# Patient Record
Sex: Male | Born: 1975 | Race: Black or African American | Hispanic: No | Marital: Single
Health system: Midwestern US, Community
[De-identification: ages and names within clinical notes are randomized; demographics above are authoritative.]

## PROBLEM LIST (undated history)

## (undated) DIAGNOSIS — F200 Paranoid schizophrenia: Secondary | ICD-10-CM

## (undated) DIAGNOSIS — F319 Bipolar disorder, unspecified: Secondary | ICD-10-CM

## (undated) DIAGNOSIS — J45909 Unspecified asthma, uncomplicated: Secondary | ICD-10-CM

## (undated) DIAGNOSIS — F209 Schizophrenia, unspecified: Secondary | ICD-10-CM

## (undated) HISTORY — PX: HERNIA REPAIR: SHX51

---

## 2012-07-21 ENCOUNTER — Encounter (HOSPITAL_BASED_OUTPATIENT_CLINIC_OR_DEPARTMENT_OTHER): Payer: Self-pay | Admitting: *Deleted

## 2012-07-21 ENCOUNTER — Emergency Department (HOSPITAL_BASED_OUTPATIENT_CLINIC_OR_DEPARTMENT_OTHER)
Admission: EM | Admit: 2012-07-21 | Discharge: 2012-07-21 | Disposition: A | Discharge status: Home / Self Care | Payer: Self-pay | Attending: Emergency Medicine | Admitting: Emergency Medicine

## 2012-07-21 DIAGNOSIS — Z8673 Personal history of transient ischemic attack (TIA), and cerebral infarction without residual deficits: Secondary | ICD-10-CM | POA: Insufficient documentation

## 2012-07-21 DIAGNOSIS — Z8781 Personal history of (healed) traumatic fracture: Secondary | ICD-10-CM | POA: Insufficient documentation

## 2012-07-21 DIAGNOSIS — K0889 Other specified disorders of teeth and supporting structures: Secondary | ICD-10-CM

## 2012-07-21 DIAGNOSIS — J45909 Unspecified asthma, uncomplicated: Secondary | ICD-10-CM | POA: Insufficient documentation

## 2012-07-21 DIAGNOSIS — F200 Paranoid schizophrenia: Secondary | ICD-10-CM | POA: Insufficient documentation

## 2012-07-21 DIAGNOSIS — K089 Disorder of teeth and supporting structures, unspecified: Secondary | ICD-10-CM | POA: Insufficient documentation

## 2012-07-21 DIAGNOSIS — Z79899 Other long term (current) drug therapy: Secondary | ICD-10-CM | POA: Insufficient documentation

## 2012-07-21 HISTORY — DX: Unspecified asthma, uncomplicated: J45.909

## 2012-07-21 HISTORY — DX: Paranoid schizophrenia: F20.0

## 2012-07-21 MED ORDER — IBUPROFEN 600 MG PO TABS: 600.0000 mg | ORAL_TABLET | Freq: Four times a day (QID) | ORAL | Status: DC | PRN

## 2012-07-21 MED ORDER — PENICILLIN V POTASSIUM 500 MG PO TABS: 500.0000 mg | ORAL_TABLET | Freq: Three times a day (TID) | ORAL | Status: DC

## 2012-07-21 NOTE — ED Notes (Signed)
MD at bedside. 

## 2012-07-21 NOTE — ED Notes (Signed)
Pt ambulating independently w/ steady gait on d/c in no acute distress, A&Ox4. D/c instructions reviewed w/ pt and family - pt and family deny any further questions or concerns at present. Rx given x2  

## 2012-07-21 NOTE — ED Notes (Signed)
Tooth pain x1 week.

## 2012-07-21 NOTE — ED Provider Notes (Signed)
   History    CSN: [id number] Arrival date & time 07/21/12  2103  First MD Initiated Contact with Patient 07/21/12 2243     Chief Complaint  Patient presents with  . Dental Pain   (Consider location/radiation/quality/duration/timing/severity/associated sxs/prior Treatment) HPI Patient presents with complaint of dental pain. He states that approximately one week ago one of his posterior molars broke off and he has been having pain in that area since that time. He denies any fever. He has had no difficulty breathing or swallowing. He does not have a dentist. Chewing and cold water on the tooth caused more pain. Past Medical History  Diagnosis Date  . Asthma   . Stroke   . Paranoid schizophrenia    Past Surgical History  Procedure Laterality Date  . Hernia repair     No family history on file. History  Substance Use Topics  . Smoking status: Never Smoker   . Smokeless tobacco: Not on file  . Alcohol Use: No    Review of Systems ROS reviewed and all otherwise negative except for mentioned in HPI  Allergies  Review of patient's allergies indicates no known allergies.  Home Medications   Current Outpatient Rx  Name  Route  Sig  Dispense  Refill  . ALBUTEROL IN   Inhalation   Inhale into the lungs.         Marland Kitchen OLANZapine (ZYPREXA) 15 MG tablet   Oral   Take 15 mg by mouth at bedtime.         Marland Kitchen ibuprofen (ADVIL,MOTRIN) 600 MG tablet   Oral   Take 1 tablet (600 mg total) by mouth every 6 (six) hours as needed for pain.   30 tablet   0   . penicillin v potassium (VEETID) 500 MG tablet   Oral   Take 1 tablet (500 mg total) by mouth 3 (three) times daily.   30 tablet   0    BP 103/64  Pulse 110  Temp(Src) 98.3 F (36.8 C) (Oral)  Resp 18  Wt 140 lb (63.504 kg)  SpO2 99% Vitals reviewed Physical Exam Physical Examination: General appearance - alert, well appearing, and in no distress Mental status - alert, oriented to person, place, and time Eyes - no  conjunctival injection, no scleral icterus Mouth - mucous membranes moist, pharynx normal without lesions, several broken molars, no evidence of periapical abscess, no swelling under tongue Neck - supple, no significant adenopathy Chest - clear to auscultation, no wheezes, rales or rhonchi, symmetric air entry Heart - normal rate, regular rhythm, normal S1, S2, no murmurs, rubs, clicks or gallops Extremities - peripheral pulses normal, no pedal edema, no clubbing or cyanosis Skin - normal coloration and turgor, no rashes  ED Course  Procedures (including critical care time) Labs Reviewed - No data to display No results found. 1. Pain, dental     MDM  Patient presenting with dental pain and has evidence of multiple fractured molars which appear chronic in nature. Patient started on penicillin and given prescription for ibuprofen. He was also given information about dental clinics and encouraged to followup with dentist.Discharged with strict return precautions.  Pt agreeable with plan.  Ethelda Chick, MD 07/22/12 [phone]

## 2012-07-21 NOTE — ED Notes (Signed)
Pt reports left lower tooth pain x1 week, states he broke his tooth on something while eating. Pt has not taken any pain meds for his pain, pain became unbearable tonight.

## 2013-01-21 ENCOUNTER — Encounter (HOSPITAL_COMMUNITY): Payer: Self-pay | Admitting: Emergency Medicine

## 2013-01-21 ENCOUNTER — Emergency Department (HOSPITAL_COMMUNITY)
Admission: EM | Admit: 2013-01-21 | Discharge: 2013-01-21 | Disposition: A | Discharge status: Home / Self Care | Payer: Medicaid Other | Attending: Emergency Medicine | Admitting: Emergency Medicine

## 2013-01-21 DIAGNOSIS — K056 Periodontal disease, unspecified: Secondary | ICD-10-CM | POA: Insufficient documentation

## 2013-01-21 DIAGNOSIS — K029 Dental caries, unspecified: Secondary | ICD-10-CM | POA: Insufficient documentation

## 2013-01-21 DIAGNOSIS — Z8673 Personal history of transient ischemic attack (TIA), and cerebral infarction without residual deficits: Secondary | ICD-10-CM | POA: Insufficient documentation

## 2013-01-21 DIAGNOSIS — F200 Paranoid schizophrenia: Secondary | ICD-10-CM | POA: Insufficient documentation

## 2013-01-21 DIAGNOSIS — J45909 Unspecified asthma, uncomplicated: Secondary | ICD-10-CM | POA: Insufficient documentation

## 2013-01-21 DIAGNOSIS — K089 Disorder of teeth and supporting structures, unspecified: Secondary | ICD-10-CM | POA: Insufficient documentation

## 2013-01-21 DIAGNOSIS — K0889 Other specified disorders of teeth and supporting structures: Secondary | ICD-10-CM

## 2013-01-21 DIAGNOSIS — K069 Disorder of gingiva and edentulous alveolar ridge, unspecified: Secondary | ICD-10-CM

## 2013-01-21 DIAGNOSIS — Z79899 Other long term (current) drug therapy: Secondary | ICD-10-CM | POA: Insufficient documentation

## 2013-01-21 MED ORDER — IBUPROFEN 600 MG PO TABS: 600.0000 mg | ORAL_TABLET | Freq: Four times a day (QID) | ORAL | Status: DC | PRN

## 2013-01-21 MED ORDER — PENICILLIN V POTASSIUM 500 MG PO TABS: 500.0000 mg | ORAL_TABLET | Freq: Three times a day (TID) | ORAL | Status: DC

## 2013-01-21 MED ORDER — IBUPROFEN 400 MG PO TABS
800.0000 mg | ORAL_TABLET | Freq: Once | ORAL | Status: AC
  Administered 2013-01-21: 800 mg via ORAL
  Filled 2013-01-21: qty 2

## 2013-01-21 NOTE — Discharge Instructions (Signed)
Dental Pain °A tooth ache may be caused by cavities (tooth decay). Cavities expose the nerve of the tooth to air and hot or cold temperatures. It may come from an infection or abscess (also called a boil or furuncle) around your tooth. It is also often caused by dental caries (tooth decay). This causes the pain you are having. °DIAGNOSIS  °Your caregiver can diagnose this problem by exam. °TREATMENT  °· If caused by an infection, it may be treated with medications which kill germs (antibiotics) and pain medications as prescribed by your caregiver. Take medications as directed. °· Only take over-the-counter or prescription medicines for pain, discomfort, or fever as directed by your caregiver. °· Whether the tooth ache today is caused by infection or dental disease, you should see your dentist as soon as possible for further care. °SEEK MEDICAL CARE IF: °The exam and treatment you received today has been provided on an emergency basis only. This is not a substitute for complete medical or dental care. If your problem worsens or new problems (symptoms) appear, and you are unable to meet with your dentist, call or return to this location. °SEEK IMMEDIATE MEDICAL CARE IF:  °· You have a fever. °· You develop redness and swelling of your face, jaw, or neck. °· You are unable to open your mouth. °· You have severe pain uncontrolled by pain medicine. °MAKE SURE YOU:  °· Understand these instructions. °· Will watch your condition. °· Will get help right away if you are not doing well or get worse. °Document Released: 12/31/2004 Document Revised: 03/25/2011 Document Reviewed: 08/19/2007 °ExitCare® Patient Information ©2014 [employer]. °  Emergency Department Resource Guide °1) Find a Doctor and Pay Out of Pocket °Although you won't have to find out who is covered by your insurance plan, it is a good idea to ask around and get recommendations. You will then need to call the office and see if the doctor you have chosen will  accept you as a new patient and what types of options they offer for patients who are self-pay. Some doctors offer discounts or will set up payment plans for their patients who do not have insurance, but you will need to ask so you aren't surprised when you get to your appointment. ° °2) Contact Your Local Health Department °Not all health departments have doctors that can see patients for sick visits, but many do, so it is worth a call to see if yours does. If you don't know where your local health department is, you can check in your phone book. The CDC also has a tool to help you locate your state's health department, and many state websites also have listings of all of their local health departments. ° °3) Find a Walk-in Clinic °If your illness is not likely to be very severe or complicated, you may want to try a walk in clinic. These are popping up all over the country in pharmacies, drugstores, and shopping centers. They're usually staffed by nurse practitioners or physician assistants that have been trained to treat common illnesses and complaints. They're usually fairly quick and inexpensive. However, if you have serious medical issues or chronic medical problems, these are probably not your best option. ° °No Primary Care Doctor: °- Call [facility] at  832-8000 - they can help you locate a primary care doctor that  accepts your insurance, provides certain services, etc. °- Physician Referral Service- 1-800-533-3463 ° °Chronic Pain Problems: °Organization           Address  Phone   Notes  °[facility]  (336) 297-2271 Patients need to be referred by their primary care doctor.  ° °Medication Assistance: °Organization         Address  Phone   Notes  °[place] [facility] [address] °[place], [place] [address] (336) 641-8030 --Must be a resident of [place] °-- Must have NO insurance coverage whatsoever (no Medicaid/ Medicare, etc.) °-- The pt.  MUST have a primary care doctor that directs their care regularly and follows them in the community °  °[facility]  (866) 331-1348   °[employer]  (888) 892-1162   ° °Agencies that provide inexpensive medical care: °Organization         Address  Phone   Notes  °[facility]  (336) 832-8035   °[facility]    (336) 832-7272   °[facility] [address] °[place], [place] [address] (336) 832-4777   °[facility] [address], °[place] (336) 271-4999   °[facility]    (336) 373-0678   °[facility]    (336) 272-1050   °[facility] ° [address], [place] Phone:  (336) 832-4444, Fax:  (336) 832-4440 Hours of Operation:  9 am - 6 pm, M-F.  Also accepts Medicaid/Medicare and self-pay.  °[facility] ° [address], [place] Phone: (336) 832-3150, Fax: (336) 832-3151. Hours of Operation:  8:30 am - 5:30 pm, M-F.  Also accepts Medicaid and self-pay.  °[facility] [place] [address], [place] Phone: (336) 878-6027   °[facility] [address], [place], [place] (336)723-1848, Ext. 123 Mondays & Thursdays: 7-9 AM.  First 15 patients are seen on a first come, first serve basis. °  ° °Medicaid-accepting [place] Providers: ° °Organization         Address  Phone   Notes  °[facility] [address], [place] (336) 641-2100 Also accepts self-pay patients.  °[facility] [address], [place] ° (336) 856-9996   °[facility] [address], [place] (336) 288-8857   °[facility] [address] [place] [address], [place] (336) 299-7000   °Veita Bland [address], [place]  ° (336) 373-1557 Only accepts [place] Access Medicaid patients after they have their name applied to their card.  ° °Self-Pay (no insurance) in  [place]: ° °Organization         Address  Phone   Notes  °Sickle Cell Patients, [facility] [address], [place] (336) 832-1970   °[facility] [address], [place] (336) 832-4400   ° [facility] ° [address],  (336) 992-4800   °[facility]/Dr. Osei-Bonsu ° [address] [place] [address], [place] or [address], [place] (336) 841-8500 Phone number for both [place] and [place] locations is the same.  °[facility] [address], [place] (336) 299-0000   °[facility] [address] [place] [address],  or [address] (336) 852-7530 °(336) 878-2260   °[facility] [address],  (336) 350-1642, phone; (336) 294-5005, fax Sees patients 1st and 3rd Saturday of every month.  Must not qualify   for public or private insurance (i.e. Medicaid, Medicare, [place] Health Choice, Veterans' Benefits) • Household income should be no more than 200% of the poverty level •The clinic cannot treat you if you are pregnant or think you are pregnant • Sexually transmitted diseases are not treated at the clinic.  ° °Dental Care: °Organization         Address  Phone  Notes  °[facility] [facility] [address], [place] (336) 641-6152 Accepts children up to age 21 who are enrolled in Medicaid or [place] Health Choice; pregnant women with a Medicaid card; and children who have applied for Medicaid or [place] Health Choice, but were declined, whose parents can pay a reduced fee at time of service.  °[facility] [place]  [address], [place] (336) 641-7733 Accepts children up to age 21 who are enrolled in Medicaid or [place] Health Choice; pregnant women with a Medicaid card; and children who have applied for Medicaid or [place] Health Choice, but were declined, whose parents  can pay a reduced fee at time of service.  °[facility] ° [address], [place] (336) 641-4533 Patients are seen by appointment only. Walk-ins are not accepted. [facility] will see patients 18 years of age and older. °Monday - Tuesday (8am-5pm) °Most Wednesdays (8:30-5pm) °$30 per visit, cash only  °[facility] ° [address], [place] (336) 641-4533 Patients are seen by appointment only. Walk-ins are not accepted. [facility] will see patients 18 years of age and older. °One Wednesday Evening (Monthly: Volunteer Based).  $30 per visit, cash only  °[facility]  (919) 537-3737 for adults; Children under age 4, call Graduate Pediatric Dentistry at (919) [phone]. Children aged 4-14, please call (919) [phone] to request a pediatric application. ° Dental services are provided in all areas of dental care including fillings, crowns and bridges, complete and partial dentures, implants, gum treatment, root canals, and extractions. Preventive care is also provided. Treatment is provided to both adults and children. °Patients are selected via a lottery and there is often a waiting list. °  °[facility] [address], °[place] ° (336) 763-8833 [email or web address] °  °[facility] [address], [place], [place] (336)723-1848, Ext. 123 Second and Fourth Thursday of each month, opens at 6:30 AM; Clinic ends at 9 AM.  Patients are seen on a first-come first-served basis, and a limited number are seen during each clinic.  ° °[facility] ° [address], [place], [place] (336) 723-7904   Eligibility Requirements °You must have lived in [place], or [place] counties for at least the last three months. °  You cannot be eligible for state or federal sponsored healthcare insurance, including Veterans Administration, Medicaid, or Medicare. °  You generally cannot be eligible for healthcare  insurance through your employer.  °  How to apply: °Eligibility screenings are held every Tuesday and Wednesday afternoon from 1:00 pm until 4:00 pm. You do not need an appointment for the interview!  °[facility] [address], [place], [place] 336-631-2330   °[facility]  336-342-8273   °[facility]  336-703-3100   °[facility]  336-570-6415   ° °Behavioral Health Resources in the Community: °Intensive Outpatient Programs °Organization         Address  Phone    Notes  °[facility] [address], [place], [place] 336-878-6098   °[facility] [address], [place], [place] 336-832-9800   °[facility] [address], [place], [place] ° 336-882-2125   °[facility] [address],  °[place], [place] 1-800-853-5163 or 336-641-4981   °Substance Abuse Resources °Organization         Address  Phone  Notes  °[facility]  336-882-2125   °[facility]  336-784-9470   °[facility] [place] [facility]  336-285-9073   °[facility]  336-845-3988   °Residential & Outpatient Substance Abuse Program  1-800-659-3381   °Psychological Services °Organization         Address  Phone  Notes  °[facility]  336- 832-9600   °[facility]  336- 378-7881   °[facility] [address], [place] 1-800-853-5163 or 336-641-4981   ° °Mobile Crisis Teams °Organization         Address  Phone  Notes  °[facility], Mobile Crisis Care Unit  1-877-626-1772   °Assertive °[facility] ° [address] [place], [place] 336-834-9664   °Sharon DeEsch [address] °[place] 336-554-5454   ° °Self-Help/Support Groups °Organization         Address  Phone             Notes  °[facility] [place] [facility] - variety of support groups  336- 373-1402 Call for more information  °Narcotics Anonymous (NA),  [facility] [address], °[place]  2 meetings at this location  ° °Residential Treatment Programs °Organization         Address  Phone  Notes  °[facility] [address],    °[place]  1-866-801-8205   °[facility] ° [address], [place], [place] 704-293-8524   °[facility] [address], [place] 336-845-3988 Admissions: 8am-3pm M-F  °[facility] [address]    °[place], [place] 336-841-1104   °[facility] [address], [place], [place] 336-379-7146   °[facility] [place] [facility] [address]  °[place], [place] 336-285-9073   °[facility] - Intensive Outpatient [address], [place], [place] 336-852-3033   °[facility] [address]  °[place], [place] 1-877-615-2722 or 336-784-9470   °[facility] [address] [place], [place] 336-227-7417 Accepts Medicaid  °[facility] [address]  °[place] 1-800-659-3381 Substance Abuse/Addiction Treatment  ° °[place] Behavioral Health Resources °Organization         Address  Phone  Notes  °[facility]  (888) 581-9988   °Julie Brannon, PhD [address] [place], [place]   (336) 349-5553 or (336) 951-0000   °[facility]   [address] °[place], [place] (336) 349-4454   °[facility] [address], [place], [place] (336) 342-8316 Insurance/Medicaid/sponsorship through [facility]  °[facility] [address]                                    [place], [place] (336) 342-8316 Therapy/tele-psych/case  °[facility] [address]  ° [place],  (336) 349-2233    °Dr. Arfeen  (336) 349-4544   °[facility] [place]  [employer] [place]   [facility] 1) [address], [place] °2) [address], [place] °3)  [address], [place] (336) 349-3220 °(336) 342-7768 ° °(336) 342-8140     °[place] [facility] Abuse [facility] (336) 342-1394 or (336) 342-3537 (After Hours)    ° °   °

## 2013-01-21 NOTE — ED Notes (Signed)
The pt has had a toothache for 2-3 days 

## 2013-01-21 NOTE — ED Provider Notes (Signed)
CSN: [id number]     Arrival date & time 01/21/13  2203 History   First MD Initiated Contact with Patient 01/21/13 2314     Chief Complaint  Patient presents with  . Dental Pain   (Consider location/radiation/quality/duration/timing/severity/associated sxs/prior Treatment) Patient is a 38 y.o. male presenting with tooth pain. The history is provided by the patient. No language interpreter was used.  Dental Pain Location:  Lower Lower teeth location:  31/RL 2nd molar Quality:  Throbbing Severity:  Mild Onset quality:  Gradual Duration:  2 days Timing:  Constant Progression:  Worsening Chronicity:  New Context: dental caries and poor dentition   Relieved by:  Nothing Worsened by:  Touching and pressure Ineffective treatments: Applying topical onion slices. Associated symptoms: no difficulty swallowing, no drooling, no facial pain, no facial swelling, no fever, no neck pain, no neck swelling, no oral lesions and no trismus   Risk factors: lack of dental care and periodontal disease     Past Medical History  Diagnosis Date  . Asthma   . Stroke   . Paranoid schizophrenia    Past Surgical History  Procedure Laterality Date  . Hernia repair     No family history on file. History  Substance Use Topics  . Smoking status: Never Smoker   . Smokeless tobacco: Not on file  . Alcohol Use: No    Review of Systems  Constitutional: Negative for fever.  HENT: Positive for dental problem. Negative for drooling, facial swelling and mouth sores.   Musculoskeletal: Negative for neck pain.  All other systems reviewed and are negative.    Allergies  Review of patient's allergies indicates no known allergies.  Home Medications   Current Outpatient Rx  Name  Route  Sig  Dispense  Refill  . ALBUTEROL IN   Inhalation   Inhale into the lungs.         Marland Kitchen ibuprofen (ADVIL,MOTRIN) 600 MG tablet   Oral   Take 1 tablet (600 mg total) by mouth every 6 (six) hours as needed for pain.  30 tablet   0   . OLANZapine (ZYPREXA) 15 MG tablet   Oral   Take 15 mg by mouth at bedtime.         . penicillin v potassium (VEETID) 500 MG tablet   Oral   Take 1 tablet (500 mg total) by mouth 3 (three) times daily.   30 tablet   0    BP 120/68  Pulse 73  Temp(Src) 97.9 F (36.6 C) (Oral)  Resp 20  Wt 142 lb 3 oz (64.496 kg)  SpO2 98%  Physical Exam  Nursing note and vitals reviewed. Constitutional: He is oriented to person, place, and time. He appears well-developed and well-nourished. No distress.  HENT:  Head: Normocephalic and atraumatic.  Mouth/Throat: Oropharynx is clear and moist. No oropharyngeal exudate.  Uvula midline and patient tolerating secretions without difficulty. Gingival redness and swelling without fluctuance to R lower 1st and 2nd molar. +Dental caries.  Eyes: Conjunctivae and EOM are normal. No scleral icterus.  Neck: Normal range of motion. Neck supple.  No nuchal rigidity or meningismus  Cardiovascular: Normal rate, regular rhythm and normal heart sounds.   Pulmonary/Chest: Effort normal. No stridor. No respiratory distress.  Musculoskeletal: Normal range of motion.  Lymphadenopathy:    He has no cervical adenopathy.  Neurological: He is alert and oriented to person, place, and time.  Skin: Skin is warm and dry. No rash noted. He is not diaphoretic. No  erythema. No pallor.  Psychiatric: He has a normal mood and affect. His behavior is normal.    ED Course  Procedures (including critical care time) Labs Review Labs Reviewed - No data to display Imaging Review No results found.  EKG Interpretation   None       MDM   1. Dentalgia    Uncomplicated dentalgia. Patient well and nontoxic appearing, hemodynamically stable, and afebrile. Uvula midline without evidence of PTA. No red flags or signs concerning for Ludwig's angina or spread of infection. Airway patent and patient tolerating secretions without difficulty. No trismus or  stridor. Patient stable for d/c with dental f/u. Veetid prescribed to cover for infection and ibuprofen advised for pain control. Return precautions dicussed and patient agreeable to plan with no unaddressed concerns.  Filed Vitals:   01/21/13 2205  BP: 120/68  Pulse: 73  Temp: 97.9 F (36.6 C)  TempSrc: Oral  Resp: 20  Weight: 142 lb 3 oz (64.496 kg)  SpO2: 98%       Antony MaduraKelly Raidyn Wassink, PA-C 01/21/13 2336

## 2013-01-22 NOTE — ED Provider Notes (Signed)
Medical screening examination/treatment/procedure(s) were performed by non-physician practitioner and as supervising physician I was immediately available for consultation/collaboration.   Dione Boozeavid Ruqaya Strauss, MD 01/22/13 [phone]

## 2013-02-07 ENCOUNTER — Encounter (HOSPITAL_COMMUNITY): Payer: Self-pay | Admitting: Emergency Medicine

## 2013-02-07 ENCOUNTER — Emergency Department (HOSPITAL_COMMUNITY)
Admission: EM | Admit: 2013-02-07 | Discharge: 2013-02-09 | Disposition: A | Discharge status: Other Acute Inpatient Hospital | Payer: Medicaid Other | Attending: Emergency Medicine | Admitting: Emergency Medicine

## 2013-02-07 ENCOUNTER — Emergency Department (HOSPITAL_COMMUNITY): Payer: Medicaid Other

## 2013-02-07 DIAGNOSIS — R45851 Suicidal ideations: Secondary | ICD-10-CM | POA: Insufficient documentation

## 2013-02-07 DIAGNOSIS — Z79899 Other long term (current) drug therapy: Secondary | ICD-10-CM | POA: Insufficient documentation

## 2013-02-07 DIAGNOSIS — R4689 Other symptoms and signs involving appearance and behavior: Secondary | ICD-10-CM

## 2013-02-07 DIAGNOSIS — R443 Hallucinations, unspecified: Secondary | ICD-10-CM | POA: Insufficient documentation

## 2013-02-07 DIAGNOSIS — Z8673 Personal history of transient ischemic attack (TIA), and cerebral infarction without residual deficits: Secondary | ICD-10-CM | POA: Insufficient documentation

## 2013-02-07 DIAGNOSIS — J45909 Unspecified asthma, uncomplicated: Secondary | ICD-10-CM | POA: Insufficient documentation

## 2013-02-07 DIAGNOSIS — R4589 Other symptoms and signs involving emotional state: Secondary | ICD-10-CM

## 2013-02-07 DIAGNOSIS — Z8659 Personal history of other mental and behavioral disorders: Secondary | ICD-10-CM | POA: Insufficient documentation

## 2013-02-07 LAB — CBC WITH DIFFERENTIAL/PLATELET
Basophils Absolute: 0 10*3/uL (ref 0.0–0.1)
Basophils Relative: 0 % (ref 0–1)
EOS PCT: 1 % (ref 0–5)
Eosinophils Absolute: 0.1 10*3/uL (ref 0.0–0.7)
HEMATOCRIT: 44.9 % (ref 39.0–52.0)
HEMOGLOBIN: 15.8 g/dL (ref 13.0–17.0)
LYMPHS ABS: 1.5 10*3/uL (ref 0.7–4.0)
LYMPHS PCT: 22 % (ref 12–46)
MCH: 29.9 pg (ref 26.0–34.0)
MCHC: 35.2 g/dL (ref 30.0–36.0)
MCV: 85 fL (ref 78.0–100.0)
MONO ABS: 0.4 10*3/uL (ref 0.1–1.0)
MONOS PCT: 6 % (ref 3–12)
NEUTROS ABS: 5 10*3/uL (ref 1.7–7.7)
Neutrophils Relative %: 71 % (ref 43–77)
Platelets: 162 10*3/uL (ref 150–400)
RBC: 5.28 MIL/uL (ref 4.22–5.81)
RDW: 13.4 % (ref 11.5–15.5)
WBC: 7 10*3/uL (ref 4.0–10.5)

## 2013-02-07 LAB — COMPREHENSIVE METABOLIC PANEL
ALBUMIN: 4.1 g/dL (ref 3.5–5.2)
ALK PHOS: 48 U/L (ref 39–117)
ALT: 17 U/L (ref 0–53)
AST: 25 U/L (ref 0–37)
BUN: 10 mg/dL (ref 6–23)
CALCIUM: 9.6 mg/dL (ref 8.4–10.5)
CO2: 27 mEq/L (ref 19–32)
Chloride: 101 mEq/L (ref 96–112)
Creatinine, Ser: 1.14 mg/dL (ref 0.50–1.35)
GFR calc non Af Amer: 81 mL/min — ABNORMAL LOW (ref >90)
GLUCOSE: 85 mg/dL (ref 70–99)
POTASSIUM: 4.2 meq/L (ref 3.7–5.3)
SODIUM: 141 meq/L (ref 137–147)
TOTAL PROTEIN: 8 g/dL (ref 6.0–8.3)
Total Bilirubin: 0.4 mg/dL (ref 0.3–1.2)

## 2013-02-07 LAB — ETHANOL: Alcohol, Ethyl (B): <11 mg/dL (ref 0–11)

## 2013-02-07 MED ORDER — ZOLPIDEM TARTRATE 5 MG PO TABS: 5.0000 mg | ORAL_TABLET | Freq: Every evening | ORAL | Status: DC | PRN

## 2013-02-07 MED ORDER — NICOTINE 21 MG/24HR TD PT24
21.0000 mg | MEDICATED_PATCH | Freq: Every day | TRANSDERMAL | Status: DC
  Administered 2013-02-07: 21 mg via TRANSDERMAL
  Filled 2013-02-07: qty 1

## 2013-02-07 MED ORDER — ACETAMINOPHEN 325 MG PO TABS: 650.0000 mg | ORAL_TABLET | ORAL | Status: DC | PRN

## 2013-02-07 MED ORDER — LORAZEPAM 1 MG PO TABS: 1.0000 mg | ORAL_TABLET | Freq: Three times a day (TID) | ORAL | Status: DC | PRN

## 2013-02-07 NOTE — ED Notes (Signed)
miniister to come in to visit patient

## 2013-02-07 NOTE — ED Notes (Addendum)
H/o MH hx, here tonight for SI, h/o similar, reports had put a belt around neck tonight PTA, then decided to walk here, "SI thoughts triggered by ending of relationship", denies SA issues, hears anger rage voices, describes as command 'to do harmful things to body", [facility] [place] [facility] here alone, new to [place] from [place],  Denies ETOH or drug use (h/o drug use), does not smoke. Last ate yesterday. Reports physical sx of numbness ("d/t cold outside"), "nausea r/t hunger", "dry mouth r/t thirst".  Denies HI.

## 2013-02-07 NOTE — ED Provider Notes (Signed)
CSN: [id number]     Arrival date & time 02/07/13  2004 History   First MD Initiated Contact with Patient 02/07/13 2038     Chief Complaint  Patient presents with  . Suicidal   (Consider location/radiation/quality/duration/timing/severity/associated sxs/prior Treatment) HPI Comments: Patient long-standing psych history has been off his Zyprexa for over a year.  He is moving to [place] within the last several months from [place].  He doesn't have any more Zyprexa because he and his several of his mental health appointments.  Tonight.  He, states he put a belt around his neck in a suicide attempt.  When this failed.  He walked in walk to walk and walked he still feeling suicidal.  He is hearing voices.  He thinks people are chasing him.  They're after him.  The history is provided by the patient.    Past Medical History  Diagnosis Date  . Asthma   . Stroke   . Paranoid schizophrenia    Past Surgical History  Procedure Laterality Date  . Hernia repair     History reviewed. No pertinent family history. History  Substance Use Topics  . Smoking status: Never Smoker   . Smokeless tobacco: Not on file  . Alcohol Use: No    Review of Systems  Constitutional: Negative for fever.  HENT: Negative for trouble swallowing.   Psychiatric/Behavioral: Positive for suicidal ideas, hallucinations and self-injury.  All other systems reviewed and are negative.    Allergies  Review of patient's allergies indicates no known allergies.  Home Medications   Current Outpatient Rx  Name  Route  Sig  Dispense  Refill  . ibuprofen (ADVIL,MOTRIN) 600 MG tablet   Oral   Take 1 tablet (600 mg total) by mouth every 6 (six) hours as needed.   30 tablet   0   . penicillin v potassium (VEETID) 500 MG tablet   Oral   Take 1 tablet (500 mg total) by mouth 3 (three) times daily.   30 tablet   0    BP 115/74  Pulse 104  Temp(Src) 98 F (36.7 C) (Oral)  Resp 16  Wt 139 lb 1 oz  (63.078 kg)  SpO2 99% Physical Exam  Nursing note and vitals reviewed. Constitutional: He is oriented to person, place, and time. He appears well-developed and well-nourished. No distress.  HENT:  Right Ear: External ear normal.  Left Ear: External ear normal.  Mouth/Throat: Oropharynx is clear and moist.  No difficulty swallowing.  No marks on the neck to indicate a strep or rope around his neck  Eyes: Pupils are equal, round, and reactive to light.  Neck: Normal range of motion. No muscular tenderness present. No tracheal deviation present.  No marks or bruising around the throat or neck  Cardiovascular: Normal rate and regular rhythm.   Pulmonary/Chest: Effort normal and breath sounds normal.  Musculoskeletal: Normal range of motion.  Neurological: He is alert and oriented to person, place, and time.  Skin: Skin is warm. No erythema.    ED Course  Procedures (including critical care time) Labs Review Labs Reviewed  COMPREHENSIVE METABOLIC PANEL - Abnormal; Notable for the following:    GFR calc non Af Amer 81 (*)    All other components within normal limits  CBC WITH DIFFERENTIAL  ETHANOL  URINE RAPID DRUG SCREEN (HOSP PERFORMED)   Imaging Review Dg Neck Soft Tissue  02/07/2013   CLINICAL DATA:  Neck pain  EXAM: NECK SOFT TISSUES - 1+ VIEW  COMPARISON:  None.  FINDINGS: There is no evidence of retropharyngeal soft tissue swelling or epiglottic enlargement. The cervical airway is unremarkable and no radio-opaque foreign body identified. Minor lower cervical degenerative disc disease at C5-6 and C6-7.  IMPRESSION: No acute finding by plain radiography   Electronically Signed   By: Ruel Favors M.D.   On: 02/07/2013 21:47    EKG Interpretation   None       MDM   1. Suicidal behavior     As it does not look like he is in any distress.  Have ordered medical screening labs, and a soft tissue neck, do, to his reported suicide attempt. Vision has been assessed by TTS.  He  meets criteria for admission.  They are in the process of obtaining a bed placement.   Arman Filter, NP 02/07/13 1610  Arman Filter, NP 02/08/13 [phone]

## 2013-02-07 NOTE — ED Notes (Signed)
House Coverage aware of need for sitter.  

## 2013-02-07 NOTE — ED Notes (Signed)
Patient transported back from xray 

## 2013-02-07 NOTE — ED Notes (Signed)
Patient transported to X-ray 

## 2013-02-07 NOTE — ED Notes (Signed)
Belongings bagged & labeled. Security called to wand pt, plan & process explained. Back to E37 with EMT, AC notified.
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
## 2013-02-07 NOTE — BH Assessment (Signed)
Received call for tele-assessment. Spoke with Earley Darren Schulz, NP who has a history of schizophrenia, is off medications and says he attempted to hang himself. He reports auditory hallucinations. Tele-assessment will be initiated.  Harlin RainFord Ellis Ria CommentWarrick Jr, LPC, [facility] Triage Specialist

## 2013-02-07 NOTE — BH Assessment (Signed)
Assessment complete. [facility] adult unit is at capacity. Consulted with Alberteen SamFran Hobson, NP who agrees Pt meets criteria for inpatient psychiatric treatment. [facility] when a bed is available and TTS will contact other facilities for placement. Notified Earley FavorGail Schulz, NP of disposition.  Harlin RainFord Ellis Ria CommentWarrick Jr, LPC, [facility] Triage Specialist

## 2013-02-07 NOTE — ED Notes (Signed)
Getting into paper scrubs, security called to wand pt. Pt remains alert, NAD, calm, interactive, cooperative, polite.

## 2013-02-07 NOTE — ED Notes (Signed)
[name] [facility](apostle)   [phone]

## 2013-02-08 ENCOUNTER — Encounter (HOSPITAL_COMMUNITY): Payer: Self-pay | Admitting: Emergency Medicine

## 2013-02-08 LAB — RAPID URINE DRUG SCREEN, HOSP PERFORMED
AMPHETAMINES: NOT DETECTED
BARBITURATES: NOT DETECTED
BENZODIAZEPINES: NOT DETECTED
COCAINE: NOT DETECTED
Opiates: NOT DETECTED
TETRAHYDROCANNABINOL: NOT DETECTED

## 2013-02-08 NOTE — Progress Notes (Signed)
B.Dakhari Zuver, MHT completed placement search by contacting the following facilities in efforts to secure placement;   [facility] for [facility] faxed for review no beds appropriate for this patient at this time [facility] for [facility] fax[facility] for review

## 2013-02-08 NOTE — ED Notes (Addendum)
Pt resting, watching TV, NAD, calm, repositioning self, visualized on CCTV monitor. Sitter present. Security rounding.

## 2013-02-08 NOTE — ED Notes (Signed)
PATIENT STATES HIS FAMILY AND KIDS HAVE "TURNED THEIR BACK ON ME" (REFERRING TO FAMILY IN [place]). STATES HE CAME HERE ABOUT 3 MONTHS AGO AND IS STAYING WITH RELATIVES. STATES THEY ARE SUPPORTIVE OF HIM. STATES HE HAD BEEN ON ZYPREXA 15 MG AT BEDTIME BUT HAS BEEN OFF FOR ABOUT A YEAR BECAUSE "THEY CLOSED MY CASE".  STATES SINCE BEING OFF THE ZYPREXA HE STATES HE HAS BEEN MORE "MOODY AND EDGY". STATES HE HAD BEEN TRYING TO RETURN TO [place] TO VISIT HIS FAMILY AND CHILDREN BUT THEY WERE NOT OPEN TO HIM COMING FOR A VISIT. STATES THAT HURT HIM AND THAT IS WHY HE TRIED TO HANG HIMSELF WITH A BELT. PT STATES HE HAS NOT SOUGHT ANY PSYCHIATRIC CARE SINCE BEING IN [place]. HE IS AGREEABLE TO ADMISSION TO A PSYCHIATRIC FACILITY.

## 2013-02-08 NOTE — ED Notes (Addendum)
Patient requested and received graham crackers and ginger ale.

## 2013-02-08 NOTE — ED Notes (Signed)
Notify Dr. Azucena Kubaocharty that patient told the nurses he felt light headedness while ambulating to the bathroom, dr. Azucena Kubadocharty review the ortho statics vital signs aware of situation no further orders given at this time

## 2013-02-08 NOTE — ED Notes (Signed)
Pt at desk to use phone.

## 2013-02-08 NOTE — BH Assessment (Signed)
Per Luwanda Daniels, AC at [facility], adult unit is at capacity. Contacted the following facilities for placement:  [facility]: At capacity [facility]: At capacity [facility]: At capacity [facility]: At capacity [facility]: At capacity [facility]: At capacity [facility]: At capacity [facility]: At capacity [facility]: At capacity [facility]: At capacity [facility]: At capacity [facility]: At capacity [facility]: At capacity [facility]: At capacity   Clenton Esper Ellis Kriss Ishler Jr, LPC, [facility] Triage Specialist  

## 2013-02-08 NOTE — ED Notes (Signed)
ALL CLOTHES , PERSONAL ITEMS WERE INVENTORY, AS WELL AS VALUABLES WERE LOCK UP IN SECURITY .

## 2013-02-09 ENCOUNTER — Inpatient Hospital Stay (HOSPITAL_COMMUNITY)
Admission: AD | Admit: 2013-02-09 | Discharge: 2013-02-18 | DRG: 885 | Disposition: A | Discharge status: Home / Self Care | Payer: Medicaid Other | Source: Intra-hospital | Attending: Psychiatry | Admitting: Psychiatry

## 2013-02-09 ENCOUNTER — Encounter (HOSPITAL_COMMUNITY): Payer: Self-pay | Admitting: *Deleted

## 2013-02-09 DIAGNOSIS — G4700 Insomnia, unspecified: Secondary | ICD-10-CM | POA: Diagnosis present

## 2013-02-09 DIAGNOSIS — F329 Major depressive disorder, single episode, unspecified: Secondary | ICD-10-CM | POA: Diagnosis present

## 2013-02-09 DIAGNOSIS — Z5987 Material hardship due to limited financial resources, not elsewhere classified: Secondary | ICD-10-CM

## 2013-02-09 DIAGNOSIS — Z818 Family history of other mental and behavioral disorders: Secondary | ICD-10-CM

## 2013-02-09 DIAGNOSIS — F29 Unspecified psychosis not due to a substance or known physiological condition: Secondary | ICD-10-CM | POA: Diagnosis present

## 2013-02-09 DIAGNOSIS — F411 Generalized anxiety disorder: Secondary | ICD-10-CM | POA: Diagnosis present

## 2013-02-09 DIAGNOSIS — F3289 Other specified depressive episodes: Secondary | ICD-10-CM | POA: Diagnosis present

## 2013-02-09 DIAGNOSIS — Z598 Other problems related to housing and economic circumstances: Secondary | ICD-10-CM

## 2013-02-09 DIAGNOSIS — T71162A Asphyxiation due to hanging, intentional self-harm, initial encounter: Secondary | ICD-10-CM | POA: Diagnosis present

## 2013-02-09 DIAGNOSIS — F200 Paranoid schizophrenia: Principal | ICD-10-CM | POA: Diagnosis present

## 2013-02-09 MED ORDER — MAGNESIUM HYDROXIDE 400 MG/5ML PO SUSP: 30.0000 mL | Freq: Every day | ORAL | Status: DC | PRN

## 2013-02-09 MED ORDER — TRAZODONE HCL 50 MG PO TABS
50.0000 mg | ORAL_TABLET | Freq: Every evening | ORAL | Status: DC | PRN
  Administered 2013-02-09: 50 mg via ORAL
  Filled 2013-02-09: qty 1
  Filled 2013-02-09: qty 3

## 2013-02-09 MED ORDER — OLANZAPINE 10 MG PO TBDP
10.0000 mg | ORAL_TABLET | Freq: Three times a day (TID) | ORAL | Status: DC | PRN
  Administered 2013-02-12: 10 mg via ORAL
  Filled 2013-02-09: qty 1

## 2013-02-09 MED ORDER — ALUM & MAG HYDROXIDE-SIMETH 200-200-20 MG/5ML PO SUSP: 30.0000 mL | ORAL | Status: DC | PRN

## 2013-02-09 MED ORDER — ACETAMINOPHEN 325 MG PO TABS: 650.0000 mg | ORAL_TABLET | Freq: Four times a day (QID) | ORAL | Status: DC | PRN

## 2013-02-09 NOTE — Progress Notes (Signed)
B.Dyke Weible, MHT completed support paper work with patient and explained program rules [facility]. He acknowledge understanding of program rules and was agreeable to being admitted. Support [facility] and original copies provided to [facility] transporter. Drinda ButtsAnnette, RN attending has arranged for transfer.

## 2013-02-09 NOTE — ED Notes (Addendum)
SOCIAL WORK AND CASE MANAGEMENT HAVE BEEN IN TO SEE PT AND GIVE HIM COMMUNITY RESOURCES TO HELP HIM WHEN HE IS RELEASED FROM THE HOSPITAL. ALSO SPOKE TO PT ABOUT HIS ZYPREXA. STATES THAT HE HASNT HAD A PRESCRIPTION FOR IT SINCE HE HAS MOVED TO [place]. HE IS NOT OPPOSED TO TAKING HIS MEDICATION.

## 2013-02-09 NOTE — ED Notes (Signed)
[facility]  CALLED  FOR  TRANSPORT   

## 2013-02-09 NOTE — ED Provider Notes (Signed)
Medical screening examination/treatment/procedure(s) were performed by non-physician practitioner and as supervising physician I was immediately available for consultation/collaboration.  EKG Interpretation   None         Shanna CiscoMegan E Docherty, MD 02/09/13 [phone]

## 2013-02-09 NOTE — Progress Notes (Signed)
Case Manager consult for discharge planning .Patient has MEDICAID [place].He reports he does not have a PCP.Education provided on Importance of establishing PCP services. Patient provided  Resources for [facility] providers.Patient receptive to resources and requests help getting a local provider.CM provided resources for the [facility]. Patient provided his consent for this CM to e-mail the [facility].

## 2013-02-09 NOTE — [facility] Counselor (Signed)
Per Tanna SavoyEric AC at [facility], pt has been accepted to bed 403-2 by Alberteen SamFran Hobson NP to services of Akintayo MD.  Evette Cristalaroline Paige Alexyss Balzarini, [place] Assessment Counselor

## 2013-02-09 NOTE — [facility] Group Notes (Signed)
Adult Psychoeducational Group Note  Date:  02/09/2013 Time:  9:07 PM  Group Topic/Focus:  Wrap-Up Group:   The focus of this group is to help patients review their daily goal of treatment and discuss progress on daily workbooks.  Participation Level:  Minimal  Participation Quality:  Attentive  Affect:  Appropriate  Cognitive:  Appropriate  Insight: Appropriate  Engagement in Group:  Limited  Modes of Intervention:  Discussion  Additional Comments:  [name] stated his day was wonderful and he got to see his wife.  He stated he has wisdom and a good personality.  Caroll RancherLindsay, Castor Gittleman A 02/09/2013, 9:07 PM

## 2013-02-09 NOTE — ED Provider Notes (Signed)
Filed Vitals:   02/09/13 0546  BP: 90/54  Pulse: 62  Temp: 98.6 F (37 C)  Resp: 18   Awaiting psych placement.  Medically stable.  Celene KrasJon R Lawton Dollinger, MD 02/09/13 [phone]

## 2013-02-09 NOTE — Progress Notes (Signed)
Patient ID: Darren [name], male   DOB: [birth year], 38 y.o.   MRN: [id number] Nursing admission note:  Patient is a 38 yo [facility]  He stated that he had walked several miles in the cold and felt someone was following him.  He stated that he had attempted to commit suicide by placing a belt around his neck and attaching it to a railing.  Patient reports command auditory hallucination to harm himself.  He had a psychiatric admission in the past ([place]) where he was treated for his schizophrenia.  Patient has now been off his medications for a year.  He states, "I was taking zyprexa 15 mg at bedtime."  He reports mood swings, decreased concentration and insomnia.  He also has lost a significant amount of weight during this past year due to decreased appetite.  He reports several prior suicide attempts.  He reports staying up all night and sleeping 4-5 hours during the day.  He denies any drug or ETOH use.  Patient states that one of his stressors is being estranged from his family.  He has a wife and 5 children that he resides with.  He states that a relative told him that his mother and other family do not wish to having anything to do with him.  Patient [place] been in the [place] area for 3 months due to a move from [place].  He is not employed and receives disability.  Patient is pleasant; speech coherent; good eye contact.  His affect is anxious; mood depressed.  He denies any SI/HI at this time.  He states the voices have decreased "in volume and are not as bad."  He is anxious to restart his medication.  Patient reports past medical hx of stroke in 2006.  He also has hx of asthma.  Patient was oriented to room and unit.

## 2013-02-09 NOTE — ED Notes (Signed)
[facility] here to transport 
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
## 2013-02-09 NOTE — Progress Notes (Signed)
CSW consult to pt for community resources. CS[facility] and [facility] process for new pt. intake. CSW also explained the [facility] services as well. Pt thought he would benefit. Pt awaiting placement.   Darren PouchDoris Solaris Kram,LCSWA [phone]

## 2013-02-10 ENCOUNTER — Encounter (HOSPITAL_COMMUNITY): Payer: Self-pay | Admitting: Psychiatry

## 2013-02-10 DIAGNOSIS — F200 Paranoid schizophrenia: Secondary | ICD-10-CM | POA: Diagnosis present

## 2013-02-10 MED ORDER — OCUVITE-LUTEIN PO CAPS: 1.0000 | ORAL_CAPSULE | Freq: Every day | ORAL | Status: DC

## 2013-02-10 MED ORDER — ALBUTEROL SULFATE HFA 108 (90 BASE) MCG/ACT IN AERS
2.0000 | INHALATION_SPRAY | Freq: Four times a day (QID) | RESPIRATORY_TRACT | Status: DC | PRN
  Administered 2013-02-13 – 2013-02-16 (×3): 2 via RESPIRATORY_TRACT
  Filled 2013-02-10: qty 6.7

## 2013-02-10 MED ORDER — OLANZAPINE 5 MG PO TBDP
15.0000 mg | ORAL_TABLET | Freq: Every day | ORAL | Status: DC
  Administered 2013-02-10 – 2013-02-13 (×4): 15 mg via ORAL
  Filled 2013-02-10 (×5): qty 1

## 2013-02-10 MED ORDER — ENSURE COMPLETE PO LIQD
237.0000 mL | Freq: Two times a day (BID) | ORAL | Status: DC
  Administered 2013-02-10 – 2013-02-18 (×13): 237 mL via ORAL

## 2013-02-10 MED ORDER — OLANZAPINE 10 MG PO TBDP: 10.0000 mg | ORAL_TABLET | Freq: Every day | ORAL | Status: DC

## 2013-02-10 MED ORDER — PROSIGHT PO TABS
1.0000 | ORAL_TABLET | Freq: Every day | ORAL | Status: DC
  Administered 2013-02-10 – 2013-02-18 (×8): 1 via ORAL
  Filled 2013-02-10: qty 1
  Filled 2013-02-10: qty 3
  Filled 2013-02-10: qty 1
  Filled 2013-02-10: qty 3
  Filled 2013-02-10 (×7): qty 1
  Filled 2013-02-10: qty 3

## 2013-02-10 NOTE — [facility] Group Notes (Signed)
[facility]  02/10/2013  11:28 AM  Type of Therapy:  Mental [employer] Presentation   Participation Level:  Active  Participation Quality:  Appropriate  Affect:  Appropriate  Cognitive:  Appropriate  Insight:  Engaged  Engagement in Therapy:  Engaged  Modes of Intervention:  Discussion, Education and Socialization   Summary of Progress/Problems:  Onalee HuaDavid from [employer] came to present his recovery story and play the guitar.  Darren NeedleMichael sat quietly and listened as the speaker told his story.  He asked the speaker the location of [facility] and the speaker's substance use history.  He discussed with the speaker about grieving through significant lost.    Darren [name]   02/10/2013  11:28 AM

## 2013-02-10 NOTE — [facility] Suicide Risk Assessment (Signed)
[facility] INPATIENT:  Family/Significant Other Suicide Prevention Education  Suicide Prevention Education:  Education Completed; Girlfriend,  [name] [phone] has been identified by the patient as the family member/significant other with whom the patient will be residing, and identified as the person(s) who will aid the patient in the event of a mental health crisis (suicidal ideations/suicide attempt).  With written consent from the patient, the family member/significant other has been provided the following suicide prevention education, prior to the and/or following the discharge of the patient.  The suicide prevention education provided includes the following:  Suicide risk factors  Suicide prevention and interventions  National Suicide Hotline telephone number  [facility] assessment telephone number  [facility] Emergency Assistance 911  [facility] and/or Residential Mobile Crisis Unit telephone number  Request made of family/significant other to:  Remove weapons (e.g., guns, rifles, knives), all items previously/currently identified as safety concern.    Remove drugs/medications (over-the-counter, prescriptions, illicit drugs), all items previously/currently identified as a safety concern.  The family member/significant other verbalizes understanding of the suicide prevention education information provided.  The family member/significant other agrees to remove the items of safety concern listed above.  Simona HuhYang, Cherelle Midkiff 02/10/2013, 11:04 AM

## 2013-02-10 NOTE — [facility] Suicide Risk Assessment (Signed)
Suicide Risk Assessment  Admission Assessment     Nursing information obtained from:    Demographic factors:    Current Mental Status:    Loss Factors:    Historical Factors:    Risk Reduction Factors:     CLINICAL FACTORS:   Depression:   Delusional Impulsivity Insomnia Schizophrenia:   Command hallucinatons Less than 37 years old Paranoid or undifferentiated type Currently Psychotic  COGNITIVE FEATURES THAT CONTRIBUTE TO RISK:  Polarized thinking    SUICIDE RISK:   Minimal: No identifiable suicidal ideation.  Patients presenting with no risk factors but with morbid ruminations; may be classified as minimal risk based on the severity of the depressive symptoms  PLAN OF CARE:1. Admit for crisis management and stabilization. 2. Medication management to reduce current symptoms to base line and improve the     patient's overall level of functioning 3. Treat health problems as indicated. 4. Develop treatment plan to decrease risk of relapse upon discharge and the need for     readmission. 5. Psycho-social education regarding relapse prevention and self care. 6. Health care follow up as needed for medical problems. 7. Restart home medications where appropriate.   I certify that inpatient services furnished can reasonably be expected to improve the patient's condition.  [name], Rocquel Askren, MD 02/10/2013, 11:25 AM

## 2013-02-10 NOTE — [facility] Group Notes (Signed)
[facility] Note   02/10/2013 10:25 AM  Participation Quality:  Engaged  Mood/Affect:  Flat  Depression Rating:  1  Anxiety Rating:  1  Thoughts of Suicide:  No Will you contract for safety?   NA  Current AVH:  No  Plan for Discharge/Comments:  [name] states it was his idea to come in "so I could get help with changing my outlook."  Says that relationships have been a challenge, especially with family.  [place]  Admits to hearing voices previous to admission, "but not anymore since I am back on Zyprexa again."  States pastor is a support.    Transportation Means: family  Supports: family  [name], Baldo DaubRodney B
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
## 2013-02-10 NOTE — Progress Notes (Signed)
Patient ID: Darren [name], male   DOB: 1975-10-05, 38 y.o.   MRN: [id number] D: patient has been up in the day room interacting with staff and others.  He has been playing cards with his peers.  He has brighter affect today; depressed mood.  He states his auditory hallucinations have decreased.  He denies SI/HI.  Patient states, "I have a long history of schizophrenia."  A: continue to monitor medication management and MD orders.  Safety checks completed every 15 minutes per protocol.  R: patient receptive to staff and his behavior is appropriate.

## 2013-02-10 NOTE — Progress Notes (Signed)
D: Patient in his room reading his bible on approach.  Patient states he ha had a good day.  Patient states he has been reading today and states he was able to go to the gym and play basketball.  Patient states, "There are people out there who have worser issues that me."  Patient states people complain about the wrong things.  Patient denies SI/HI and denies AVH. A: Staff to monitor Q 15 mins for safety.  Encouragement and support offered.  Scheduled medications administered per orders. R: Patient remains safe on the unit.  Patient attended group tonight.  Patient visible on the unit and interacting with peers.  Patient taking adminsitered medications.

## 2013-02-10 NOTE — Progress Notes (Signed)
NUTRITION ASSESSMENT  Pt identified as at risk on the Malnutrition Screen Tool  INTERVENTION: 1. Educated patient on the importance of nutrition and encouraged intake of food and beverages. 2. Discussed weight goals. 3. Supplements: MVI daily and Ensure Complete po BID, each supplement provides 350 kcal and 13 grams of protein   NUTRITION DIAGNOSIS: Unintentional weight loss related to sub-optimal intake as evidenced by pt report.   Goal: Pt to meet >/= 90% of their estimated nutrition needs.  Monitor:  PO intake  Assessment:  Patient admitted with psychosis.  Reports good intake and improving appetite.  Also reports good appetite prior to admit but "was not eating what I should".  Requested MVI and receptive to Ensure.  UBW 168 lbs 2 years ago per patient, stable for the past 6 months per e-chart.  38 y.o. male  Height: Ht Readings from Last 1 Encounters:  02/09/13 5\' 6"  (1.676 m)    Weight: Wt Readings from Last 1 Encounters:  02/09/13 139 lb (63.05 kg)    Weight Hx: Wt Readings from Last 10 Encounters:  02/09/13 139 lb (63.05 kg)  02/07/13 139 lb 1 oz (63.078 kg)  01/21/13 142 lb 3 oz (64.496 kg)  07/21/12 140 lb (63.504 kg)    BMI:  Body mass index is 22.45 kg/(m^2). Pt meets criteria for normal weight based on current BMI.  Estimated Nutritional Needs: Kcal: 25-30 kcal/kg Protein: > 1 gram protein/kg Fluid: 1 ml/kcal  Diet Order: General Pt is also offered choice of unit snacks mid-morning and mid-afternoon.  Pt is eating as desired.   Lab results and medications reviewed.   Oran ReinLaura Kit Brubacher, RD, LDN Clinical Inpatient Dietitian Pager:  [phone] Weekend and after hours pager:  [phone]

## 2013-02-10 NOTE — Tx Team (Signed)
  Interdisciplinary Treatment Plan Update   Date Reviewed: 02/10/2013 Time Reviewed:  8:10 AM  Progress in Treatment:   Attending groups: Yes Participating in groups: Yes Taking medication as prescribed: Yes  Tolerating medication: Yes Family/Significant other contact made: Yes Patient understands diagnosis: Yes, AEB asking for help with medication.      Discussing patient identified problems/goals with staff: Yes.  See initial care plan Medical problems stabilized or resolved: Yes Denies suicidal/homicidal ideation: Yes, In txt team Patient has not harmed self or others: Yes For review of initial/current patient goals, please see plan of care.  Estimated Length of Stay:  4-5 Days   Reasons for Continued Hospitalization:  Auditory Hallucination  Medication stabilization Suicidal ideation  New Problems/Goals identified:    Discharge Plan or Barriers:   Pt plans to return home, follow up outp.     Additional Comments: Patient is a 38 yo male who [facility]. He stated that he had walked several miles in the cold and felt someone was following him. He stated that he had attempted to commit suicide by placing a belt around his neck and attaching it to a railing. Patient reports command auditory hallucination to harm himself. He had a psychiatric admission in the past (state of [place]) where he was treated for his schizophrenia. Patient has now been off his medications for a year. He states, "I was taking zyprexa 15 mg at bedtime." He reports mood swings, decreased concentration and insomnia. He also has lost a significant amount of weight during this past year due to decreased appetite. He reports several prior suicide attempts. He reports staying up all night and sleeping 4-5 hours during the day. He denies any drug or ETOH use. Patient states that one of his stressors is being estranged from his family. He has a wife and 5 children that he resides with. He states that a  relative told him that his mother and other family do not wish to having anything to do with him. Patient has only been in the [place] area for 3 months due to a move from [place]. He is not employed and receives disability.    Attendees:  Signature: Thedore MinsMojeed Akintayo, MD  02/09/2013 8:08 AM   Signature: Richelle Itood Aracelis Ulrey, LCSW  02/09/2013 8:08 AM   Signature: Fransisca KaufmannLaura Davis, NP  02/09/2013 8:08 AM   Signature: Joslyn Devonaroline Beaudry, RN  02/09/2013 8:08 AM   Signature: Liborio NixonPatrice White, RN  02/09/2013 8:08 AM   Signature:  02/09/2013 8:08 AM   Signature:  02/09/2013 8:08 AM   Signature:    Signature:    Signature:    Signature:    Signature:    Signature:    Scribe for Treatment Team:  Simona Huhina Yang MSW Intern 02/09/2013 8:08 AM
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
## 2013-02-11 NOTE — BHH Group Notes (Signed)
BHH Group Notes:  (Counselor/Nursing/MHT/Case Management/Adjunct)  02/11/2013 1:15PM  Type of Therapy:  Group Therapy  Participation Level:  Minimal  Participation Quality:  Appropriate  Affect:  Lethargic  Cognitive:  Oriented  Insight:  Improving  Engagement in Group:  Limited  Engagement in Therapy:  Limited  Modes of Intervention:  Discussion, Exploration and Socialization  Summary of Progress/Problems: The topic for group was balance in life.  Pt participated in the discussion about when their life was in balance and out of balance and how this feels.  Pt discussed ways to get back in balance and short term goals they can work on to get where they want to be. [name] was sleeping for much of the group.  He answered when directly spoken to, but interaction was minimal. Talked abut being in the [employer], and that "good followers make good leaders," but could not really clarify what that meant.  Said that he slept through lunch, and that the medication must be making him tired.   [name], Jakhia Buxton B 02/11/2013 1:51 PM

## 2013-02-11 NOTE — Progress Notes (Signed)
[facility] Note  02/11/2013 11:08 AM Darren [name]  MRN:  [id number] Subjective:" I have been going through a lot stress since we moved to [place] from [place].'' Objective: Patient reports that he is feeling less stressed out today. He reports decreased mood swings, paranoia and psychosis. Patient states that he is happy to get back on his medication last night which helped her to get some sleep. He id still reporting passive suicidal thoughts but contracting for safety. He is compliant with Zyprexa and has not endorsed any adverse reactions. 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 Asthma     ADL's:  Intact  Sleep: Fair  Appetite:  Fair  Suicidal Ideation: yes Plan:  denies Intent:  denies Means:  denies Homicidal Ideation:  Plan:  denies Intent:  denies Means:  denies AEB (as evidenced by):  Psychiatric Specialty Exam: Review of Systems  Constitutional: Negative.   HENT: Negative.   Eyes: Negative.   Respiratory: Negative.   Cardiovascular: Negative.   Gastrointestinal: Negative.   Genitourinary: Negative.   Musculoskeletal: Negative.   Skin: Negative.   Neurological: Negative.   Endo/Heme/Allergies: Negative.   Psychiatric/Behavioral: Positive for suicidal ideas and hallucinations. The patient is nervous/anxious.     Blood pressure 123/82, pulse 100, temperature 97.6 F (36.4 C), temperature source Oral, resp. rate 18, height 5\' 6"  (1.676 m), weight 63.05 kg (139 lb).Body mass index is 22.45 kg/(m^2).  General Appearance: Disheveled  Eye Contact::  Minimal  Speech:  Garbled  Volume:   Decreased  Mood:  Dysphoric  Affect:  Blunt  Thought Process:  Circumstantial  Orientation:  Full (Time, Place, and Person)  Thought Content:  Delusions and Hallucinations: Auditory  Suicidal Thoughts:  Yes.  without intent/plan  Homicidal Thoughts:  No  Memory:  Immediate;   Fair Recent;   Fair Remote;   Fair  Judgement:  Poor  Insight:  Lacking  Psychomotor Activity:  Decreased  Concentration:  Fair  Recall:  Fair  Akathisia:  No  Handed:  Right  AIMS (if indicated):     Assets:  Communication Skills Desire for Improvement Social Support  Sleep:  Number of Hours: 5.75   Current Medications: Current Facility-Administered Medications  Medication Dose Route Frequency Provider Last Rate Last Dose  . acetaminophen (TYLENOL) tablet 650 mg  650 mg Oral Q6H PRN Fransisca KaufmannLaura Davis, NP      . albuterol (PROVENTIL HFA;VENTOLIN HFA) 108 (90 BASE) MCG/ACT inhaler 2 puff  2 puff Inhalation Q6H PRN Fransisca KaufmannLaura Davis, NP      . alum & mag hydroxide-simeth (MAALOX/MYLANTA) 200-200-20 MG/5ML suspension 30 mL  30 mL Oral Q4H PRN Fransisca KaufmannLaura Davis, NP      . feeding supplement (ENSURE COMPLETE) (ENSURE COMPLETE) liquid 237 mL  237 mL Oral BID BM Jeoffrey MassedLaura Lee Jobe, RD   237 mL at 02/11/13 0957  . magnesium hydroxide (MILK OF MAGNESIA) suspension 30 mL  30 mL Oral Daily PRN Fransisca KaufmannLaura Davis, NP      . multivitamin (PROSIGHT) tablet 1 tablet  1 tablet Oral Daily Nylani Michetti   1 tablet at 02/11/13 0819  . OLANZapine zydis (ZYPREXA) disintegrating tablet 10 mg  10 mg Oral Q8H PRN Fransisca KaufmannLaura Davis, NP      .  OLANZapine zydis (ZYPREXA) disintegrating tablet 15 mg  15 mg Oral QHS Brienne Liguori   15 mg at 02/10/13 2123  . traZODone (DESYREL) tablet 50 mg  50 mg Oral QHS PRN Fransisca Kaufmann, NP   50 mg at 02/09/13 2232    Lab Results: No results found for this or any previous visit (from the past 48 hour(s)).  Physical Findings: AIMS: Facial and Oral Movements Muscles of Facial Expression: None, normal Lips and Perioral Area:  None, normal Jaw: None, normal Tongue: None, normal,Extremity Movements Upper (arms, wrists, hands, fingers): None, normal Lower (legs, knees, ankles, toes): None, normal, Trunk Movements Neck, shoulders, hips: None, normal, Overall Severity Severity of abnormal movements (highest score from questions above): None, normal Incapacitation due to abnormal movements: None, normal, Dental Status Current problems with teeth and/or dentures?: No Does patient usually wear dentures?: No  CIWA:    COWS:     Treatment Plan Summary: Daily contact with patient to assess and evaluate symptoms and progress in treatment Medication management  Plan:1. Admit for crisis management and stabilization. 2. Medication management to reduce current symptoms to base line and improve the  patient's overall level of functioning:Continue Zyprexa 15mg  po Qhs for psychosis/delusions/mood. 3. Treat health problems as indicated. 4. Develop treatment plan to decrease risk of relapse upon discharge and the need for readmission. 5. Psycho-social education regarding relapse prevention and self care. 6. Health care follow up as needed for medical problems. 7. Restart home medications where appropriate.   Medical Decision Making Problem Points:  Established problem, stable/improving (1), Review of last therapy session (1) and Review of psycho-social stressors (1) Data Points:  Order Aims Assessment (2) Review or order clinical lab tests (1) Review of medication regiment & side effects (2) Review of new medications or change in dosage (2)  I certify that inpatient services furnished can reasonably be expected to improve the patient's condition.   Thedore Mins, MD 02/11/2013, 11:08 AM

## 2013-02-11 NOTE — Progress Notes (Signed)
Adult Psychoeducational Group Note  Date:  02/11/2013 Time:  10:57 PM  Group Topic/Focus:  Goals Group:   The focus of this group is to help patients establish daily goals to achieve during treatment and discuss how the patient can incorporate goal setting into their daily lives to aide in recovery.  Participation Level:  Active  Participation Quality:  Appropriate  Affect:  Appropriate  Cognitive:  Appropriate  Insight: Appropriate  Engagement in Group:  Engaged  Modes of Intervention:  Discussion  Additional Comments:  Pt stated that his day was good, the highlight of the day was him going off the unity and playing ball with the others.  Terie PurserParker, Mykeria Garman R 02/11/2013, 10:57 PM

## 2013-02-11 NOTE — Progress Notes (Signed)
D: Patient in the dayroom on approach.  Patient appear flat and depressed.  Patient states there is nothing wrong but appears sad.  Patient states his wife did not visit today.  Patient states he has had a lot of things on his mind like his wife's pregnancy.  Patient states he is worried about what her body has to go through.  Patient states he has learned that he has to take things one day at a time and states he has learned about self control. A: Staff to monitor Q 15 mins for safety.  Encouragement and support offered.  Scheduled medications administered per orders. R: Patient remains safe on the unit.  Patient attended group tonight.  Patient visible on the unit tonight.  Patient taking administered medications.

## 2013-02-11 NOTE — Progress Notes (Signed)
Patient ID: Darren [name], male   DOB: 09-03-1975, 38 y.o.   MRN: [id number]  D: Pt. Denies SI/HI and A/V Hallucinations. Patient does not report any pain or discomfort at this time. Patient did not fill out daily inventory sheet.  A: Support and encouragement provided to the patient to come to writer with any questions or concerns. Scheduled medications given to patient per physician's orders.  R: Patient is receptive and cooperative but minimal and forwards very little. Patient did not attend morning wellness group and is only seen in the milieu minimally. Q15 minute checks are maintained for safety.

## 2013-02-11 NOTE — Progress Notes (Signed)
Psychoeducational Group Note  Date:  02/10/2013 Time:  1015  Group Topic/Focus:  Crisis Planning:   The purpose of this group is to help patients create a crisis plan for use upon discharge or in the future, as needed.  Participation Level: Did Not Attend  Participation Quality:  Not Applicable  Affect:  Not Applicable  Cognitive:  Not Applicable  Insight:  Not Applicable  Engagement in Group: Not Applicable  Additional Comments:  PT STAYED IN ROOM. DID NOT ATTEND  Devin Foskey A 02/11/2013, 7:43 AM

## 2013-02-11 NOTE — Progress Notes (Signed)
Patient ID: Darren [name], male   DOB: 04-12-1975, 38 y.o.   MRN: [id number]  Morning Wellness Group 10:00  The focus of this group is to educate the patient on the purpose and policies of crisis stabilization and provide a format to answer questions about their admission.  The group details unit policies and expectations of patients while admitted.  Patient did not attend

## 2013-02-12 NOTE — BHH Group Notes (Signed)
BHH LCSW Group Therapy  02/12/2013 1:07 PM  Type of Therapy:  Group Therapy   Participation Level:  Minimal  Participation Quality:  Drowsy  Affect:  Flat  Cognitive:  Appropriate  Insight:  Engaged  Engagement in Therapy:  Lacking  Modes of Intervention:  Discussion and Socialization   Summary of Progress/Problems:  Chaplain was here to lead a group on themes of hope and courage.  Darren NeedleMichael presented as drowsy.  He defined community as the people who participate actively  and make it a better place.  He identified his positive community as his wife and the church.  He identified themes pf prayer, motivation and trust as those things that are part of a supportive community.     Darren [name]   02/12/2013  1:07 PM

## 2013-02-12 NOTE — Progress Notes (Signed)
Psychoeducational Group Note  Date:  02/12/2013 Time:  8:00 pm.   Group Topic/Focus:  Wrap-Up Group:   The focus of this group is to help patients review their daily goal of treatment and discuss progress on daily workbooks.  Participation Level: Did Not Attend  Participation Quality:  Not Applicable  Affect:  Not Applicable  Cognitive:  Not Applicable  Insight:  Not Applicable  Engagement in Group: Not Applicable  Additional Comments:  The patient did not attend since he was asleep.   Budd Freiermuth S 02/12/2013, 10:11 PM

## 2013-02-12 NOTE — [facility] Group Notes (Signed)
[facility]   02/12/2013 9:53 AM  Participation Quality:  Minimal  Mood/Affect:  Flat  Depression Rating:  1  Anxiety Rating:  1  Thoughts of Suicide:  No Will you contract for safety?   NA  Current AVH:  No  Plan for Discharge/Comments:  Darren NeedleMichael sat quietly, looking down during group.  He stated that he feels like he is doing a bit better today.  Had nothing else to offer.  Is happy with the Zyprexa.  Is OK with being with us through the weekend.  Transportation Means: friend  Supports: friend   [name], [name]

## 2013-02-12 NOTE — Progress Notes (Signed)
Patient ID: Darren [name], male   DOB: Sep 18, 1975, 38 y.o.   MRN: [id number] D. Patient presents with suspicious, paranoid mood, affect flat. Patient very guarded and not forthcoming of thoughts to writer, although compliant with ward rules. Patient states when writer asked of reason for admission '' I wasn't right in the head. But It's a lot going on, can't talk about it now '' Patient has been isolative to room throughout most of shift with minimal interaction with peers. Pt completed self inventory and rates depression at 1/10 on depression scale 10 being worst depressed 1 being least, however mood and affect not congruent with what patient reports. Pt appears to be intermittently anxious at times as well. A. Medications given as ordered, support and encouragement provided.Discussed above information with treatment team. R. Patient has been cooperative, with no further voiced concerns at this time. Will continue to monitor q 15 minutes for safety.

## 2013-02-12 NOTE — Progress Notes (Signed)
Patient ID: Darren [name], male   DOB: Jan 31, 1975, 38 y.o.   MRN: [id number] [facility] MD Progress Note  02/12/2013 1:39 PM Darren [name]  MRN:  [id number] Subjective: Patient states "I feel like my mood is better now that I'm back on medication. I'm feeling like being more active now. I've prayed about my life stressors so I'm not as worried."  Objective: Patient is reporting decreased symptoms of paranoia, mood swings, auditory hallucinations since being restarted on Zyprexa Zydis. The patient had been off medications for the last year prior to his admission to [facility]. Patient is visible on the unit today and attending the scheduled groups. The patient rates his depression very low but continues to appear very sad. Darren [name] is very guarded during his interactions with staff and forwards very little information. The patient is not willing to reveal details of his psychosocial stressors. Nursing staff have expressed concern that the patient has been acting paranoid on the unit and acting suspicious of others. He appears to be minimizing his psychiatric symptoms. Patient was noted to receive a prn dose of Zyprexa Zydis 10 this morning for psychosis.   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 Asthma     ADL's:  Intact  Sleep: Fair  Appetite:  Fair  Suicidal Ideation:  Passive SI with no plan  Homicidal Ideation:  Plan:  denies Intent:  denies Means:  denies AEB (as evidenced by):  Psychiatric Specialty Exam: Review of Systems  Constitutional: Negative.   HENT: Negative.   Eyes: Negative.   Respiratory: Negative.   Cardiovascular: Negative.   Gastrointestinal: Negative.   Genitourinary: Negative.    Musculoskeletal: Negative.   Skin: Negative.   Neurological: Negative.   Endo/Heme/Allergies: Negative.   Psychiatric/Behavioral: Positive for depression, suicidal ideas and hallucinations. Negative for memory loss and substance abuse. The patient is nervous/anxious. The patient does not have insomnia.     Blood pressure 124/88, pulse 100, temperature 97.7 F (36.5 C), temperature source Oral, resp. rate 18, height 5\' 6"  (1.676 m), weight 63.05 kg (139 lb).Body mass index is 22.45 kg/(m^2).  General Appearance: Casual  Eye Contact::  Minimal  Speech:  Clear and Coherent  Volume:  Decreased  Mood:  Dysphoric  Affect:  Blunt  Thought Process:  Circumstantial  Orientation:  Full (Time, Place, and Person)  Thought Content:  Delusions and Hallucinations: Auditory  Suicidal Thoughts:  Yes.  without intent/plan  Homicidal Thoughts:  No  Memory:  Immediate;   Fair Recent;   Fair Remote;   Fair  Judgement:  Poor  Insight:  Lacking  Psychomotor Activity:  Decreased  Concentration:  Fair  Recall:  Fair  Akathisia:  No  Handed:  Right  AIMS (if indicated):     Assets:  Communication Skills Desire for Improvement Social Support  Sleep:  Number of Hours: 6.75   Current Medications: Current Facility-Administered Medications  Medication Dose Route Frequency Provider Last Rate Last Dose  . acetaminophen (TYLENOL) tablet 650 mg  650 mg Oral Q6H PRN Darren Kaufmann, NP      . albuterol (PROVENTIL HFA;VENTOLIN HFA) 108 (90 BASE) MCG/ACT inhaler 2 puff  2 puff Inhalation Q6H PRN Darren Kaufmann, NP      . alum & mag hydroxide-simeth (MAALOX/MYLANTA) 200-200-20 MG/5ML suspension 30 mL  30 mL Oral Q4H PRN Darren KaufmannLaura Davis, NP      . feeding supplement (ENSURE COMPLETE) (ENSURE COMPLETE) liquid 237 mL  237 mL Oral BID BM Darren MassedLaura Lee [name], RD   237 mL at 02/12/13 1000  . magnesium hydroxide (MILK OF MAGNESIA) suspension 30 mL  30 mL Oral Daily PRN Darren KaufmannLaura Davis, NP      . multivitamin (PROSIGHT) tablet 1 tablet   1 tablet Oral Daily Darren [name]   1 tablet at 02/12/13 0802  . OLANZapine zydis (ZYPREXA) disintegrating tablet 10 mg  10 mg Oral Q8H PRN Darren KaufmannLaura Davis, NP   10 mg at 02/12/13 0802  . OLANZapine zydis (ZYPREXA) disintegrating tablet 15 mg  15 mg Oral QHS Darren [name]   15 mg at 02/11/13 2115  . traZODone (DESYREL) tablet 50 mg  50 mg Oral QHS PRN Darren KaufmannLaura Davis, NP   50 mg at 02/09/13 2232    Lab Results: No results found for this or any previous visit (from the past 48 hour(s)).  Physical Findings: AIMS: Facial and Oral Movements Muscles of Facial Expression: None, normal Lips and Perioral Area: None, normal Jaw: None, normal Tongue: None, normal,Extremity Movements Upper (arms, wrists, hands, fingers): None, normal Lower (legs, knees, ankles, toes): None, normal, Trunk Movements Neck, shoulders, hips: None, normal, Overall Severity Severity of abnormal movements (highest score from questions above): None, normal Incapacitation due to abnormal movements: None, normal, Dental Status Current problems with teeth and/or dentures?: No Does patient usually wear dentures?: No  CIWA:    COWS:     Treatment Plan Summary: Daily contact with patient to assess and evaluate symptoms and progress in treatment Medication management  Plan:1. Continue crisis management and stabilization. 2. Medication management to reduce current symptoms to base line and improve the patient's overall level of functioning:Continue Zyprexa 15mg  po Qhs for psychosis/delusions/mood and 10 mg every eight hours prn agitation or acute psychosis.  3. Treat health problems as indicated. 4. Develop treatment plan to decrease risk of relapse upon discharge and the need for readmission. 5. Psycho-social education regarding relapse prevention and self care. 6. Health care follow up as needed for medical problems. Continue albuterol inhaler prn asthma symptoms of shortness of breath or wheezing.  7. Restart home medications  where appropriate.  Medical Decision Making Problem Points:  Established problem, stable/improving (1), Review of last therapy session (1) and Review of psycho-social stressors (1) Data Points:  Order Aims Assessment (2) Review of medication regiment & side effects (2) Review of new medications or change in dosage (2)  I certify that inpatient services furnished can reasonably be expected to improve the patient's condition.   Darren KaufmannDAVIS, LAURA, NP-C 02/12/2013, 1:39 PM  Patient seen, evaluated and I agree with notes by Nurse Practitioner. Thedore MinsMojeed Arion Morgan, MD

## 2013-02-12 NOTE — Progress Notes (Signed)
Adult Psychoeducational Group Note  Date:  02/12/2013 Time:  10:22 AM  Group Topic/Focus:  Early Warning Signs:   The focus of this group is to help patients identify signs or symptoms they exhibit before slipping into an unhealthy state or crisis.  Participation Level:  Did Not Attend  Additional Comments:  Pt did not attend group due to being in the bed asleep.  Cathlean CowerClouse, Micheline Markes Y 02/12/2013, 10:22 AM

## 2013-02-13 DIAGNOSIS — F29 Unspecified psychosis not due to a substance or known physiological condition: Secondary | ICD-10-CM

## 2013-02-13 NOTE — [facility] Group Notes (Signed)
[facility] Group Notes:  (Nursing/MHT/Case Management/Adjunct)  Date:  02/13/2013  Time:  0930 Type of Therapy:  Nurse Education  Participation Level:  Did Not Attend  Participation Quality:    Affect:    Cognitive:    Insight:    Engagement in Group:    Modes of Intervention:    Summary of Progress/Problems:  Darren [name], Darren [name] 02/13/2013, 10:44 AM

## 2013-02-13 NOTE — Progress Notes (Signed)
The focus of this group is to help patients review their daily goal of treatment and discuss progress on daily workbooks. Pt attended the evening group session and responded to all discussion prompts from the Writer. Pt shared that today was a good day on the unit, the highlight of which was going to the gym to play basketball. Pt reported having no additional needs from Nursing Staff. Pt's affect was appropriate.

## 2013-02-13 NOTE — Progress Notes (Signed)
Patient ID: Darren [name], male   DOB: 06/16/1975, 38 y.o.   MRN: [id number] [facility] MD Progress Note  02/13/2013 11:01 AM Darren [name]  MRN:  [id number] Subjective: endorses stress and feel somewhat recovered but guarded. Objective: Patient reports that he is feeling less stressed out today. Tolerting zyprexa. Still endorses paranoia. Somewhat sedated today. Endorses some hallucinations but not commanding. Mood depressed with suicidal toughts but no plan. 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 Asthma     ADL's:  Intact  Sleep: Fair  Appetite:  Fair  Suicidal Ideation: yes Plan:  denies Intent:  denies Means:  denies Homicidal Ideation:  Plan:  denies Intent:  denies Means:  denies AEB (as evidenced by):  Psychiatric Specialty Exam: Review of Systems  Constitutional: Negative.   HENT: Negative.   Eyes: Negative.   Respiratory: Negative.   Cardiovascular: Negative.   Gastrointestinal: Negative.   Genitourinary: Negative.   Musculoskeletal: Negative.   Skin: Negative.   Neurological: Negative.   Endo/Heme/Allergies: Negative.   Psychiatric/Behavioral: Positive for suicidal ideas and hallucinations. The patient is nervous/anxious.     Blood pressure 127/85, pulse 116, temperature 97.6 F (36.4 C), temperature source Oral, resp. rate 16, height 5\' 6"  (1.676 m), weight 63.05 kg (139 lb).Body mass index is 22.45 kg/(m^2).  General Appearance: Disheveled  Eye Contact::  Minimal  Speech:  Garbled  Volume:  Decreased  Mood:  Dysphoric  Affect:  Blunt  Thought Process:  Circumstantial  Orientation:  Full (Time, Place, and Person)  Thought Content:  Delusions and Hallucinations:  Auditory  Suicidal Thoughts:  Yes.  without intent/plan  Homicidal Thoughts:  No  Memory:  Immediate;   Fair Recent;   Fair Remote;   Fair  Judgement:  Poor  Insight:  Lacking  Psychomotor Activity:  Decreased  Concentration:  Fair  Recall:  Fair  Akathisia:  No  Handed:  Right  AIMS (if indicated):     Assets:  Communication Skills Desire for Improvement Social Support  Sleep:  Number of Hours: 5.75   Current Medications: Current Facility-Administered Medications  Medication Dose Route Frequency Provider Last Rate Last Dose  . acetaminophen (TYLENOL) tablet 650 mg  650 mg Oral Q6H PRN Fransisca Kaufmann, NP      . albuterol (PROVENTIL HFA;VENTOLIN HFA) 108 (90 BASE) MCG/ACT inhaler 2 puff  2 puff Inhalation Q6H PRN Fransisca Kaufmann, NP      . alum & mag hydroxide-simeth (MAALOX/MYLANTA) 200-200-20 MG/5ML suspension 30 mL  30 mL Oral Q4H PRN Fransisca Kaufmann, NP      . feeding supplement (ENSURE COMPLETE) (ENSURE COMPLETE) liquid 237 mL  237 mL Oral BID BM Jeoffrey Massed, RD   237 mL at 02/12/13 1341  . magnesium hydroxide (MILK OF MAGNESIA) suspension 30 mL  30 mL Oral Daily PRN Fransisca Kaufmann, NP      . multivitamin (PROSIGHT) tablet 1 tablet  1 tablet Oral Daily Mojeed Akintayo   1 tablet at 02/12/13 0802  . OLANZapine zydis (ZYPREXA) disintegrating tablet 10 mg  10 mg Oral Q8H PRN Fransisca Kaufmann, NP   10 mg at 02/12/13 0802  . OLANZapine zydis (ZYPREXA) disintegrating tablet 15 mg  15 mg Oral QHS Mojeed Akintayo   15 mg at 02/12/13 2149  . traZODone (DESYREL)  tablet 50 mg  50 mg Oral QHS PRN Fransisca KaufmannLaura Davis, NP   50 mg at 02/09/13 2232    Lab Results: No results found for this or any previous visit (from the past 48 hour(s)).  Physical Findings: AIMS: Facial and Oral Movements Muscles of Facial Expression: None, normal Lips and Perioral Area: None, normal Jaw: None, normal Tongue: None, normal,Extremity Movements Upper (arms, wrists, hands, fingers): None, normal Lower (legs, knees, ankles, toes):  None, normal, Trunk Movements Neck, shoulders, hips: None, normal, Overall Severity Severity of abnormal movements (highest score from questions above): None, normal Incapacitation due to abnormal movements: None, normal, Dental Status Current problems with teeth and/or dentures?: No Does patient usually wear dentures?: No  CIWA:    COWS:     Treatment Plan Summary: Daily contact with patient to assess and evaluate symptoms and progress in treatment Medication management  Plan:1. Admit for crisis management and stabilization. 2. Medication management to reduce current symptoms to base line and improve the  patient's overall level of functioning:Continue Zyprexa 15mg  po Qhs for psychosis/delusions/mood. 3. Treat health problems as indicated. 4. Develop treatment plan to decrease risk of relapse upon discharge and the need for readmission. 5. Psycho-social education regarding relapse prevention and self care. 6. Health care follow up as needed for medical problems. 7. Restart home medications where appropriate.   Medical Decision Making Problem Points:  Established problem, stable/improving (1), Review of last therapy session (1) and Review of psycho-social stressors (1) Data Points:  Review medications and side effects.  Inpatient services furnished can reasonably be expected to improve the patient's condition.   Thresa RossAKHTAR, Drevon Plog, MD 02/13/2013, 11:01 AM

## 2013-02-13 NOTE — [facility] Group Notes (Signed)
[facility] Group Notes: (Clinical Social Work)   02/13/2013      Type of Therapy:  Group Therapy   Participation Level:  Did Not Attend    Darren MantleMareida Grossman-Orr, LCSW 02/13/2013, 1:15 PM

## 2013-02-13 NOTE — Progress Notes (Addendum)
Darren [name]  is  status quo, remaining  Guarded; isolative and to himself.  He does not want to talk to this nurse when he is approached.   A He refused to take his morning meds. He did not attend any group thus far and he has not completed his daily self inventory sheet that he is requested to.   R Safety is in place and poc cont to be maintaiend.

## 2013-02-14 MED ORDER — OLANZAPINE 10 MG PO TBDP
10.0000 mg | ORAL_TABLET | Freq: Every day | ORAL | Status: DC
  Administered 2013-02-14 – 2013-02-17 (×4): 10 mg via ORAL
  Filled 2013-02-14 (×2): qty 1
  Filled 2013-02-14 (×2): qty 3
  Filled 2013-02-14 (×2): qty 1
  Filled 2013-02-14: qty 3

## 2013-02-14 NOTE — [facility] Group Notes (Signed)
[facility] Group Notes:  (Nursing/MHT/Case Management/Adjunct)  Date:  02/14/2013  Time:  1000  Type of Therapy:  spirituality  Participation Level:  Minimal  Participation Quality:  Appropriate  Affect:  Appropriate  Cognitive:  Appropriate  Insight:  Appropriate  Engagement in Group:  Improving  Modes of Intervention:  Socialization  Summary of Progress/Problems:  Darren [name], Darren [name] 02/14/2013, 11:30 AM

## 2013-02-14 NOTE — [facility] Group Notes (Signed)
[facility] Group Notes:  (Clinical Social Work)  02/14/2013   11:15am-12:00pm  Summary of Progress/Problems:  The main focus of today's process group was to listen to a variety of genres of music and to identify that different types of music provoke different responses.  The patient then was able to identify personally what was soothing for them, as well as energizing.  Handouts were used to record feelings evoked, as well as how patient can personally use this knowledge in sleep habits, with depression, and with other symptoms.  The patient expressed understanding of concepts, as well as knowledge of how each type of music affected him and how this can be used at home as a wellness/recovery tool.  Type of Therapy:  Music Therapy   Participation Level:  Active  Participation Quality:  Attentive and Sharing  Affect:  Blunted  Cognitive:  Oriented  Insight:  Engaged  Engagement in Therapy:  Engaged  Modes of Intervention:   Activity, Exploration  Ambrose MantleMareida Grossman-Orr, LCSW 02/14/2013, 12:30pm

## 2013-02-14 NOTE — Progress Notes (Signed)
Pt states his day has been good. More visibile in milieu tonight and pt reporting less sedation from zyprexa. Somewhat guarded but no paranoia noted or expressed. Medicated per orders. Albuterol inhaler required this evening with relief. Support and encouragement given. He denies SI/HI/AVH and remains safe. Lawrence MarseillesFriedman, Vaneza Pickart Eakes

## 2013-02-14 NOTE — Progress Notes (Signed)
Patient ID: Darren [name], male   DOB: 1975-07-18, 38 y.o.   [id number] [facility]  02/14/2013 10:48 AM Darren [name]  MRN:  [id number] Subjective: endorses stress and feel somewhat recovered but guarded. Objective: Patient reports that he is feeling less stressed out today. Tolerting zyprexa but appears sedated. Still endorses paranoia. Somewhat sedated today. Endorses some hallucinations but not commanding. Mood depressed vague suicidal toughts  but no plan. 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 Asthma     ADL's:  Intact  Sleep: Fair  Appetite:  Fair  Suicidal Ideation: yes Plan:  denies Intent:  denies Means:  denies Homicidal Ideation:  Plan:  denies Intent:  denies Means:  denies AEB (as evidenced by):  Psychiatric Specialty Exam: Review of Systems  Constitutional: Negative.   HENT: Negative.   Eyes: Negative.   Respiratory: Negative.   Cardiovascular: Negative.   Gastrointestinal: Negative.   Genitourinary: Negative.   Musculoskeletal: Negative.   Skin: Negative.   Neurological: Negative.   Endo/Heme/Allergies: Negative.   Psychiatric/Behavioral: Positive for suicidal ideas and hallucinations. The patient is nervous/anxious.     Blood pressure 127/85, pulse 116, temperature 97.6 F (36.4 C), temperature source Oral, resp. rate 16, height 5\' 6"  (1.676 m), weight 63.05 kg (139 lb).Body mass index is 22.45 kg/(m^2).  General Appearance: Disheveled  Eye Contact::  Minimal  Speech:  Garbled  Volume:  Decreased  Mood:  Dysphoric  Affect:  Blunt  Thought Process:  Circumstantial  Orientation:  Full (Time, Place, and Person)  Thought Content:  Delusions and  Hallucinations: Auditory  Suicidal Thoughts:  Yes.  without intent/plan  Homicidal Thoughts:  No  Memory:  Immediate;   Fair Recent;   Fair Remote;   Fair  Judgement:  Poor  Insight:  Lacking  Psychomotor Activity:  Decreased  Concentration:  Fair  Recall:  Fair  Akathisia:  No  Handed:  Right  AIMS (if indicated):     Assets:  Communication Skills Desire for Improvement Social Support  Sleep:  Number of Hours: 6   Current Medications: Current Facility-Administered Medications  Medication Dose Route Frequency Provider Last Rate Last Dose  . acetaminophen (TYLENOL) tablet 650 mg  650 mg Oral Q6H PRN Fransisca KaufmannLaura Davis, NP      . albuterol (PROVENTIL HFA;VENTOLIN HFA) 108 (90 BASE) MCG/ACT inhaler 2 puff  2 puff Inhalation Q6H PRN Fransisca KaufmannLaura Davis, NP   2 puff at 02/13/13 1859  . alum & mag hydroxide-simeth (MAALOX/MYLANTA) 200-200-20 MG/5ML suspension 30 mL  30 mL Oral Q4H PRN Fransisca KaufmannLaura Davis, NP      . feeding supplement (ENSURE COMPLETE) (ENSURE COMPLETE) liquid 237 mL  237 mL Oral BID BM Jeoffrey MassedLaura Lee Jobe, RD   237 mL at 02/14/13 1044  . magnesium hydroxide (MILK OF MAGNESIA) suspension 30 mL  30 mL Oral Daily PRN Fransisca KaufmannLaura Davis, NP      . multivitamin (PROSIGHT) tablet 1 tablet  1 tablet Oral Daily Mojeed Akintayo   1 tablet at 02/14/13 1044  . OLANZapine zydis (ZYPREXA) disintegrating tablet 10 mg  10 mg Oral Q8H PRN Fransisca KaufmannLaura Davis, NP   10 mg at 02/12/13 0802  . OLANZapine zydis (ZYPREXA) disintegrating tablet 15 mg  15 mg Oral QHS Mojeed Akintayo   15 mg  at 02/13/13 2222  . traZODone (DESYREL) tablet 50 mg  50 mg Oral QHS PRN Fransisca Kaufmann, NP   50 mg at 02/09/13 2232    Lab Results: No results found for this or any previous visit (from the past 48 hour(s)).  Physical Findings: AIMS: Facial and Oral Movements Muscles of Facial Expression: None, normal Lips and Perioral Area: None, normal Jaw: None, normal Tongue: None, normal,Extremity Movements Upper (arms, wrists, hands, fingers): None,  normal Lower (legs, knees, ankles, toes): None, normal, Trunk Movements Neck, shoulders, hips: None, normal, Overall Severity Severity of abnormal movements (highest score from questions above): None, normal Incapacitation due to abnormal movements: None, normal, Dental Status Current problems with teeth and/or dentures?: No Does patient usually wear dentures?: No  CIWA:    COWS:     Treatment Plan Summary: Daily contact with patient to assess and evaluate symptoms and progress in treatment Medication management Decrease zyprexa because of ongoing sedation.  Plan:1. Admit for crisis management and stabilization. 2. Medication management to reduce current symptoms to base line and improve the  patient's overall level of functioning:Continue Zyprexa 15mg  po Qhs for psychosis/delusions/mood. 3. Treat health problems as indicated. 4. Develop treatment plan to decrease risk of relapse upon discharge and the need for readmission. 5. Psycho-social education regarding relapse prevention and self care. 6. Health care follow up as needed for medical problems. 7. Restart home medications where appropriate.   Medical Decision Making Problem Points:  Established problem, stable/improving (1), Review of last therapy session (1) and Review of psycho-social stressors (1) Data Points:  Review medications and side effects.  Inpatient services furnished can reasonably be expected to improve the patient's condition.   Thresa Ross, MD 02/14/2013, 10:48 AM

## 2013-02-14 NOTE — Progress Notes (Signed)
D- Patient is out in milieu interacting with peers and attending groups.  He did not complete a patient inventory sheet this shift.  He is brighter than observed on previous day and is compliant with medications with no prn's requested.  He denies SI and A/V/H.  A- Positive feedback and support offered. Continue current POC and evaluation of treatment goals.  Continue 15' checks for safety.  R- Safety maintained.

## 2013-02-14 NOTE — Progress Notes (Signed)
After being isolative today, pt has been visible in the dayroom and observed making frequent phone calls. He is somewhat withdrawn during his 1:1 interaction with this Clinical research associatewriter but is pleasant and cooperative. States he is doing much better now that he is back on his zyprexa. Medicated per orders without difficulty. Support, encouragement offered. Denies SI/HI/AVH and remains safe. Lawrence MarseillesFriedman, Ellyce Lafevers Eakes

## 2013-02-15 DIAGNOSIS — F3289 Other specified depressive episodes: Secondary | ICD-10-CM | POA: Diagnosis present

## 2013-02-15 DIAGNOSIS — F329 Major depressive disorder, single episode, unspecified: Secondary | ICD-10-CM | POA: Diagnosis present

## 2013-02-15 MED ORDER — BUPROPION HCL 100 MG PO TABS
100.0000 mg | ORAL_TABLET | Freq: Every day | ORAL | Status: DC
Start: 2013-02-15 — End: 2013-02-18
  Administered 2013-02-15 – 2013-02-18 (×4): 100 mg via ORAL
  Filled 2013-02-15: qty 1
  Filled 2013-02-15 (×3): qty 3
  Filled 2013-02-15 (×3): qty 1

## 2013-02-15 NOTE — Progress Notes (Signed)
The focus of this group is to help patients review their daily goal of treatment and discuss progress on daily workbooks. Pt attended the evening group session and responded to all discussion prompts from the Writer. Pt shared that today was a good day on the unit, the highlight of which was going to the gym to play basketball. Pt's only additional need from Nursing Staff this evening was to do laundry, which was taken care of following group. Pt stated his long term goal was to work in [employer] and possibly start his own restaurant. Pt's peers encouraged these goals and becoming well enough to accomplish them as the first steps. Pt's affect was appropriate.

## 2013-02-15 NOTE — Progress Notes (Signed)
Patient ID: Darren [name], male   DOB: [birth year], 38 y.o.   MRN: [id number] [facility]  02/15/2013 10:36 AM Darren [name]  MRN:  [id number] Subjective: "I am feeling depressed.'' Objective: Patient reports low energy level, lack of motivation, feeling stressed out and depressed. However, he endorsed decreased paranoia and psychosis. He states that his medication for psychosis is finally kicking in. He is requesting to be placed on an antidepressant. Patient denies homicidal and suicidal thoughts today. He has been attending group therapy and other unit activities.  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 Asthma     ADL's:  Intact  Sleep: Fair  Appetite:  Fair  Suicidal Ideation: yes Plan:  denies Intent:  denies Means:  denies Homicidal Ideation:  Plan:  denies Intent:  denies Means:  denies AEB (as evidenced by):  Psychiatric Specialty Exam: Review of Systems  Constitutional: Negative.   HENT: Negative.   Eyes: Negative.   Respiratory: Negative.   Cardiovascular: Negative.   Gastrointestinal: Negative.   Genitourinary: Negative.   Musculoskeletal: Negative.   Skin: Negative.   Neurological: Negative.   Endo/Heme/Allergies: Negative.   Psychiatric/Behavioral: Positive for depression and hallucinations. The patient is nervous/anxious.     Blood pressure 100/71, pulse 96, temperature 97.9 F (36.6 C), temperature source Oral, resp. rate 18, height 5\' 6"  (1.676 m), weight 63.05 kg (139 lb).Body mass index is 22.45 kg/(m^2).  General Appearance: fairly groomed  Patent attorneyye Contact::  Minimal  Speech:  normal  Volume:   Decreased  Mood:  Dysphoric  Affect:  Blunt  Thought Process:  Circumstantial  Orientation:  Full (Time, Place, and Person)  Thought Content:  Delusions and Hallucinations: Auditory  Suicidal Thoughts:  denies  Homicidal Thoughts:  No  Memory:  Immediate;   Fair Recent;   Fair Remote;   Fair  Judgement:  Poor  Insight:  Lacking  Psychomotor Activity:  Decreased  Concentration:  Fair  Recall:  Fair  Akathisia:  No  Handed:  Right  AIMS (if indicated):     Assets:  Communication Skills Desire for Improvement Social Support  Sleep:  Number of Hours: 6   Current Medications: Current Facility-Administered Medications  Medication Dose Route Frequency Provider Last Rate Last Dose  . acetaminophen (TYLENOL) tablet 650 mg  650 mg Oral Q6H PRN Fransisca KaufmannLaura Davis, NP      . albuterol (PROVENTIL HFA;VENTOLIN HFA) 108 (90 BASE) MCG/ACT inhaler 2 puff  2 puff Inhalation Q6H PRN Fransisca KaufmannLaura Davis, NP   2 puff at 02/14/13 2153  . alum & mag hydroxide-simeth (MAALOX/MYLANTA) 200-200-20 MG/5ML suspension 30 mL  30 mL Oral Q4H PRN Fransisca KaufmannLaura Davis, NP      . buPROPion [facility](WELLBUTRIN) tablet 100 mg  100 mg Oral Daily Jodi Criscuolo      . feeding supplement (ENSURE COMPLETE) (ENSURE COMPLETE) liquid 237 mL  237 mL Oral BID BM Jeoffrey MassedLaura Lee Jobe, RD   237 mL at 02/15/13 0809  . magnesium hydroxide (MILK OF MAGNESIA) suspension 30 mL  30 mL Oral Daily PRN Fransisca KaufmannLaura Davis, NP      . multivitamin (PROSIGHT) tablet 1 tablet  1 tablet Oral Daily Haliyah Fryman   1  tablet at 02/15/13 1610  . OLANZapine zydis (ZYPREXA) disintegrating tablet 10 mg  10 mg Oral Q8H PRN Fransisca Kaufmann, NP   10 mg at 02/12/13 0802  . OLANZapine zydis (ZYPREXA) disintegrating tablet 10 mg  10 mg Oral QHS Thresa Ross, MD   10 mg at 02/14/13 2156  . traZODone (DESYREL) tablet 50 mg  50 mg Oral QHS PRN Fransisca Kaufmann, NP   50 mg at 02/09/13 2232    Lab Results: No results found for this or any previous visit (from the past 48 hour(s)).  Physical  Findings: AIMS: Facial and Oral Movements Muscles of Facial Expression: None, normal Lips and Perioral Area: None, normal Jaw: None, normal Tongue: None, normal,Extremity Movements Upper (arms, wrists, hands, fingers): None, normal Lower (legs, knees, ankles, toes): None, normal, Trunk Movements Neck, shoulders, hips: None, normal, Overall Severity Severity of abnormal movements (highest score from questions above): None, normal Incapacitation due to abnormal movements: None, normal, Dental Status Current problems with teeth and/or dentures?: No Does patient usually wear dentures?: No  CIWA:    COWS:     Treatment Plan Summary: Daily contact with patient to assess and evaluate symptoms and progress in treatment Medication management Decrease zyprexa because of ongoing sedation.  Plan:1. Admit for crisis management and stabilization. 2. Medication management to reduce current symptoms to base line and improve the  patient's overall level of functioning:Continue Zyprexa 10mg  po Qhs for psychosis/delusions/mood. Initiate Wellbutrin 100mg  po dialy for depression. 3. Treat health problems as indicated. 4. Develop treatment plan to decrease risk of relapse upon discharge and the need for readmission. 5. Psycho-social education regarding relapse prevention and self care. 6. Health care follow up as needed for medical problems.  Medical Decision Making Problem Points:  Established problem, improving (1)  New Problem: depression. Review of last therapy session (1) and Review of psycho-social stressors (1) Data Points:  Review medications and side effects.  Inpatient services furnished can reasonably be expected to improve the patient's condition.   Thedore Mins, MD 02/15/2013, 10:36 AM

## 2013-02-15 NOTE — Progress Notes (Signed)
Patient ID: Darren [name], male   DOB: [birth year], 38 y.o.   MRN: [id number] D: patient presents with sad affect; depressed mood. Patient is somewhat guarded with staff and is slow to respond to questions.  He is less animated today.   He rates his depression and hopelessness and 1/10.  Patient states, "I just miss my family.  I want to be with them."  He met with treatment team and is agreeable to stay a couple more days.  He agreed to an antidepressant to help with his lack of motivation.  He denies any SI/HI/AVH at this time.  He attends group and participates in his treatment. A: continue to monitor medication management and MD orders.  Safety checks completed every 15 minutes per protocol.  Encourage and support patient as needed.  R: patient is receptive to staff; his behavior is appropriate.

## 2013-02-15 NOTE — Progress Notes (Signed)
D: Patient in the dayroom playing cards with peers on approach.  Patient brightens when he speaks to Clinical research associatewriter.  Patient states he feels good.  Patient states he does not know when he will be going home.  Patient states he needs to get home because his wife can have a baby at any time.  Patient denies SI/HI and denies AVH. A: Staff to monitor Q 15 mins for safety.  Encouragement and support offered.  Scheduled medications administered per orders. R: Patient remains safe on the unit.  Patient attended group tonight.  Patient visible on the unit and interacting with peers.

## 2013-02-15 NOTE — [facility] Group Notes (Signed)
[place] LCSW Group Therapy  02/15/2013 1:15 pm  Type of Therapy: Process Group Therapy  Participation Level:  Minimal  Participation Quality:  Appropriate  Affect:  Flat  Cognitive:  Oriented  Insight:  Improving  Engagement in Group:  Limited  Engagement in Therapy:  Limited  Modes of Intervention:  Activity, Clarification, Education, Problem-solving and Support  Summary of Progress/Problems: Today's group addressed the issue of overcoming obstacles.  Patients were asked to identify their biggest obstacle post d/c that stands in the way of their on-going success, and then problem solve as to how to manage this.  Darren [name] had his eyes closed for about half of the group.  He responded immediately when I called on him, saying that his immediate family does not believe in him, and the only person he has for support is [place].  Others problem solved with him about where he might find some other postive friends, like he has met here.  He was open to, and appreciative of, the feedback.  Trish Mage 02/15/2013   4:15 PM

## 2013-02-15 NOTE — Tx Team (Signed)
  Interdisciplinary Treatment Plan Update   Date Reviewed:  02/15/2013  Time Reviewed:  4:28 PM  Progress in Treatment:   Attending groups: Yes Participating in groups: Yes Taking medication as prescribed: Yes  Tolerating medication: Yes Family/Significant other contact made: Yes  Patient understands diagnosis: Yes  Discussing patient identified problems/goals with staff: Yes Medical problems stabilized or resolved: Yes Denies suicidal/homicidal ideation: Yes Patient has not harmed self or others: Yes  For review of initial/current patient goals, please see plan of care.  Estimated Length of Stay:  2-3 days  Reason for Continuation of Hospitalization: Depression Medication stabilization  New Problems/Goals identified:  N/A  Discharge Plan or Barriers:   return home, follow up outpt  Additional Comments:  Pt denies symptoms, but still appears depressed.  Wellbutrin started today.  Attendees:  Signature: Thedore MinsMojeed Akintayo, MD 02/15/2013 4:28 PM   Signature: Richelle Itood Jian Hodgman, LCSW 02/15/2013 4:28 PM  Signature: Fransisca KaufmannLaura Davis, NP 02/15/2013 4:28 PM  Signature: Joslyn Devonaroline Beaudry, RN 02/15/2013 4:28 PM  Signature: Liborio NixonPatrice White, RN 02/15/2013 4:28 PM  Signature:  02/15/2013 4:28 PM  Signature:   02/15/2013 4:28 PM  Signature:    Signature:    Signature:    Signature:    Signature:    Signature:      Scribe for Treatment Team:   Richelle Itood Shanicqua Coldren, LCSW  02/15/2013 4:28 PM

## 2013-02-15 NOTE — [facility] Group Notes (Signed)
[facility]   02/15/2013 4:14 PM  Participation Quality:  Minmal  Mood/Affect:  Depressed  Depression Rating:  1  Anxiety Rating:  1  Thoughts of Suicide:  No Will you contract for safety?   NA  Current AVH:  No  Plan for Discharge/Comments:  Darren [name] states that he had a good weekend.  He had no visitors.  His friend Archie Pattenonya was out of town in [place].  She returns tomorrow.  He denies any symptoms, but still appears depressed, and is not agitating to leave.    Transportation Means: friend  Supports: friend  [name], [place] BowRodney B

## 2013-02-16 NOTE — Progress Notes (Signed)
Patient ID: Darren [name], male   DOB: 12-26-1975, 38 y.o.   MRN: [id number] [facility] MD Progress Note  02/16/2013 1:08 PM Darren [name]  MRN:  [id number] Subjective:  Patient states "I am feeling less depressed today. I am looking forward to seeing my fiance."   Objective:  Patient reports decreased symptoms of depression and psychosis since being restarted on his psychiatric medication. He is also optimistic that the Wellbutrin that he was started on yesterday will also continue to decrease his depressive symptoms. Darren [name] is rating his depression at two today. However, he continues to appears depressed and at times withdrawn. Darren [name] was noted to be asleep around ten am this morning when writer went to assess him. Patient took several prompts from writer to wake up to answer questions. He is still experiencing symptoms of low energy level and lack of medication. Patient reports that he has not heard any voices so far today. Patient has been attending groups and is compliant with his current medication regimen.   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 Asthma     ADL's:  Intact  Sleep: Good  Appetite:  Good  Suicidal Ideation: Denies  Plan:  denies Intent:  denies Means:  denies Homicidal Ideation:  Plan:  denies Intent:  denies Means:  denies AEB (as evidenced by):  Psychiatric Specialty Exam: Review of Systems  Constitutional: Negative.   HENT: Negative.   Eyes: Negative.   Respiratory: Negative.   Cardiovascular: Negative.   Gastrointestinal: Negative.   Genitourinary: Negative.   Musculoskeletal: Negative.   Skin: Negative.    Neurological: Negative.   Endo/Heme/Allergies: Negative.   Psychiatric/Behavioral: Positive for depression and hallucinations. Negative for suicidal ideas, memory loss and substance abuse. The patient is nervous/anxious. The patient does not have insomnia.     Blood pressure 119/82, pulse 91, temperature 97.8 F (36.6 C), temperature source Oral, resp. rate 20, height 5\' 6"  (1.676 m), weight 63.05 kg (139 lb).Body mass index is 22.45 kg/(m^2).  General Appearance: fairly groomed  Patent attorney::  Fair  Speech:  normal  Volume:  Decreased  Mood:  Dysphoric  Affect:  Blunt  Thought Process:  Circumstantial  Orientation:  Full (Time, Place, and Person)  Thought Content:  WDL  Suicidal Thoughts:  denies  Homicidal Thoughts:  No  Memory:  Immediate;   Fair Recent;   Fair Remote;   Fair  Judgement:  Poor  Insight:  Lacking  Psychomotor Activity:  Decreased  Concentration:  Fair  Recall:  Fair  Akathisia:  No  Handed:  Right  AIMS (if indicated):     Assets:  Communication Skills Desire for Improvement Social Support  Sleep:  Number of Hours: 6.25   Current Medications: Current Facility-Administered Medications  Medication Dose Route Frequency Provider Last Rate Last Dose  . acetaminophen (TYLENOL) tablet 650 mg  650 mg Oral Q6H PRN Fransisca Kaufmann, NP      . albuterol (PROVENTIL HFA;VENTOLIN HFA) 108 (90 BASE) MCG/ACT inhaler 2 puff  2 puff Inhalation Q6H PRN Fransisca Kaufmann, NP   2 puff at 02/14/13 2153  . alum & mag hydroxide-simeth (MAALOX/MYLANTA) 200-200-20 MG/5ML suspension 30 mL  30 mL Oral Q4H  PRN Fransisca KaufmannLaura Davis, NP      . buPROPion [facility](WELLBUTRIN) tablet 100 mg  100 mg Oral Daily Dyan Labarbera   100 mg at 02/16/13 0802  . feeding supplement (ENSURE COMPLETE) (ENSURE COMPLETE) liquid 237 mL  237 mL Oral BID BM Darren MassedLaura Lee Jobe, RD   237 mL at 02/16/13 0804  . magnesium hydroxide (MILK OF MAGNESIA) suspension 30 mL  30 mL Oral Daily PRN Fransisca KaufmannLaura Davis, NP      . multivitamin (PROSIGHT)  tablet 1 tablet  1 tablet Oral Daily Chandra Feger   1 tablet at 02/16/13 0802  . OLANZapine zydis (ZYPREXA) disintegrating tablet 10 mg  10 mg Oral Q8H PRN Fransisca KaufmannLaura Davis, NP   10 mg at 02/12/13 0802  . OLANZapine zydis (ZYPREXA) disintegrating tablet 10 mg  10 mg Oral QHS Thresa RossNadeem Akhtar, MD   10 mg at 02/15/13 2124  . traZODone (DESYREL) tablet 50 mg  50 mg Oral QHS PRN Fransisca KaufmannLaura Davis, NP   50 mg at 02/09/13 2232    Lab Results: No results found for this or any previous visit (from the past 48 hour(s)).  Physical Findings: AIMS: Facial and Oral Movements Muscles of Facial Expression: None, normal Lips and Perioral Area: None, normal Jaw: None, normal Tongue: None, normal,Extremity Movements Upper (arms, wrists, hands, fingers): None, normal Lower (legs, knees, ankles, toes): None, normal, Trunk Movements Neck, shoulders, hips: None, normal, Overall Severity Severity of abnormal movements (highest score from questions above): None, normal Incapacitation due to abnormal movements: None, normal, Dental Status Current problems with teeth and/or dentures?: No Does patient usually wear dentures?: No  CIWA:    COWS:     Treatment Plan Summary: Daily contact with patient to assess and evaluate symptoms and progress in treatment Medication management  Plan:1. Continue crisis management and stabilization. 2. Medication management to reduce current symptoms to base line and improve the  patient's overall level of functioning: Continue Zyprexa 10mg  po Qhs for psychosis/delusions/mood. Continue Wellbutrin 100mg  po daily for depression. 3. Develop treatment plan to decrease risk of relapse upon discharge and the need for readmission. Possible discharge tomorrow 02/17/13. Patient encouraged to contact fiance in advance.  4. Psycho-social education regarding relapse prevention and self care.  Medical Decision Making Problem Points:  Established problem, improving (2)  Review of last therapy session (1)  and Review of psycho-social stressors (1) Data Points:  Review medications and side effects.  Inpatient services furnished can reasonably be expected to improve the patient's condition.   Fransisca KaufmannDAVIS, LAURA, NP-C 02/16/2013, 1:08 PM  Patient seen, evaluated and I agree with notes by Nurse Practitioner. Thedore MinsMojeed Chez Bulnes, MD

## 2013-02-16 NOTE — [facility] Group Notes (Signed)
Adult Psychoeducational Group Note  Date:  02/16/2013 Time:  0900am  Group Topic/Focus:  Orientation:   The focus of this group is to educate the patient on the purpose and policies of crisis stabilization and provide a format to answer questions about their admission.  The group details unit policies and expectations of patients while admitted.  Participation Level:  Did Not Attend  Darren [name], Darren [name] Darren [name] 02/16/2013, 10:31 AM

## 2013-02-16 NOTE — Progress Notes (Signed)
D: Pt observed sitting in the dayroom, engaged with other pts on the unit, playing games. No complaints verbalized by pt this evening.  A: Medications administered as ordered per MD. 15 minute checks performed for safety. Verbal support given.  R: Pt safety maintained.

## 2013-02-16 NOTE — Progress Notes (Signed)
D:  Per pt self inventory pt reports sleeping poor, appetite improving, rates depression at a 5 out of 10 and hopelessness at a 4 out of 10, denies SI/Hi/AVH, pt is bright and pleasant during interaction.     A:  Emotional support provided, Encouraged pt to continue with treatment plan and attend all group activities, q15 min checks maintained for safety.  R:  Pt is receptive, calm, going to groups, interacting with other patients and staff appropriately.

## 2013-02-16 NOTE — [facility] Group Notes (Signed)
[facility] LCSW Group Therapy  02/16/2013 , 2:46 PM   Type of Therapy:  Group Therapy  Participation Level:  None  Participation Quality: In Attentive  Affect:  Appropriate  Cognitive:  Alert  Insight:  Limited  Engagement in Therapy:  None  Modes of Intervention:  Discussion, Exploration and Socialization  Summary of Progress/Problems: Today's group focused on the term Diagnosis.  Participants were asked to define the term, and then pronounce whether it is a negative, positive or neutral term.  Sat with eyes closed the entire group.  No interaction.  [name], Keymoni Mccaster B 02/16/2013 , 2:46 PM

## 2013-02-16 NOTE — Progress Notes (Signed)
Adult Psychoeducational Group Note  Date:  02/16/2013 Time:  8:00 pm  Group Topic/Focus:  Wrap-Up Group:   The focus of this group is to help patients review their daily goal of treatment and discuss progress on daily workbooks.  Participation Level:  Active  Participation Quality:  Appropriate and Sharing  Affect:  Appropriate  Cognitive:  Appropriate  Insight: Appropriate  Engagement in Group:  Engaged  Modes of Intervention:  Discussion, Education, Socialization and Support  Additional Comments:  Pt stated he is in the hospital for self harm. Pt stated that he has a good sense of humor and spiritual wisdom.   Laural BenesJohnson, Lydiana Milley 02/16/2013, 8:53 PM

## 2013-02-17 MED ORDER — OLANZAPINE 10 MG PO TBDP: 10.0000 mg | ORAL_TABLET | Freq: Every day | ORAL | Status: DC

## 2013-02-17 MED ORDER — PROSIGHT PO TABS: 1.0000 | ORAL_TABLET | Freq: Every day | ORAL | Status: DC

## 2013-02-17 MED ORDER — BUPROPION HCL 100 MG PO TABS: 100.0000 mg | ORAL_TABLET | Freq: Every day | ORAL | Status: DC

## 2013-02-17 MED ORDER — TRAZODONE HCL 50 MG PO TABS
50.0000 mg | ORAL_TABLET | Freq: Every evening | ORAL | Status: DC | PRN
Start: 2013-02-17 — End: 2013-05-09

## 2013-02-17 NOTE — [facility] Suicide Risk Assessment (Signed)
   Demographic Factors:  Male, Low socioeconomic status, Unemployed and African American  Total Time spent with patient: 15 minutes  Psychiatric Specialty Exam: Physical Exam  Psychiatric: He has a normal mood and affect. His speech is normal and behavior is normal. Judgment and thought content normal. Cognition and memory are normal.    Review of Systems  Constitutional: Negative.   HENT: Negative.   Eyes: Negative.   Respiratory: Negative.   Cardiovascular: Negative.   Gastrointestinal: Negative.   Genitourinary: Negative.   Musculoskeletal: Negative.   Skin: Negative.   Neurological: Negative.   Endo/Heme/Allergies: Negative.   Psychiatric/Behavioral: Negative.     Blood pressure 119/78, pulse 91, temperature 97.2 F (36.2 C), temperature source Oral, resp. rate 20, height 5\' 6"  (1.676 m), weight 63.05 kg (139 lb).Body mass index is 22.45 kg/(m^2).  General Appearance: Fairly Groomed  Patent attorneyye Contact::  Good  Speech:  Clear and Coherent and Normal Rate  Volume:  Normal  Mood:  Euthymic  Affect:  Appropriate  Thought Process:  Goal Directed and Logical  Orientation:  Full (Time, Place, and Person)  Thought Content:  Negative  Suicidal Thoughts:  No  Homicidal Thoughts:  No  Memory:  Immediate;   Good Recent;   Good Remote;   Good  Judgement:  Intact  Insight:  Fair  Psychomotor Activity:  Normal  Concentration:  Good  Recall:  Good  Fund of Knowledge:Good  Language: Good  Akathisia:  No  Handed:  Right  AIMS (if indicated):     Assets:  Communication Skills Desire for Improvement Physical Health Social Support  Sleep:  Number of Hours: 6.25    Musculoskeletal: Strength & Muscle Tone: within normal limits Gait & Station: normal Patient leans: N/A   Mental Status Per Nursing Assessment::   On Admission:     Current Mental Status by Physician: patient denies suicidal ideation, intent or plan  Loss Factors: Financial problems/change in socioeconomic  status  Historical Factors: Impulsivity  Risk Reduction Factors:   Sense of responsibility to family, Living with another person, especially a relative and Positive social support  Continued Clinical Symptoms:  Resolving depression and psychosis  Cognitive Features That Contribute To Risk:  Closed-mindedness Polarized thinking    Suicide Risk:  Minimal: No identifiable suicidal ideation.  Patients presenting with no risk factors but with morbid ruminations; may be classified as minimal risk based on the severity of the depressive symptoms  Discharge Diagnoses:   AXIS I:  Paranoid schizophrenia              Unspecified depressive disorder AXIS II:  Deferred AXIS III:   Past Medical History  Diagnosis Date  . Asthma    AXIS IV:  economic problems, other psychosocial or environmental problems and problems related to social environment AXIS V:  61-70 mild symptoms  Plan Of Care/Follow-up recommendations:  Activity:  as tolerated  Diet:  healthy Tests:  routine blood work up Other:  patient to keep his after care appointment  Is patient on multiple antipsychotic therapies at discharge:  No   Has Patient had three or more failed trials of antipsychotic monotherapy by history:  No  Recommended Plan for Multiple Antipsychotic Therapies: NA    [name], Lathen Seal, MD 02/17/2013, 9:05 AM

## 2013-02-17 NOTE — [facility] Group Notes (Signed)
Adult Psychoeducational Group Note  Date:  02/17/2013 Time:  9:21 PM  Group Topic/Focus:  Wrap-Up Group:   The focus of this group is to help patients review their daily goal of treatment and discuss progress on daily workbooks.  Participation Level:  Active  Participation Quality:  Appropriate  Affect:  Appropriate  Cognitive:  Appropriate  Insight: Appropriate  Engagement in Group:  Engaged  Modes of Intervention:  Discussion  Additional Comments:  [name] stated he had a good day, he got to "show out on the basketball court".  [place].  Caroll RancherLindsay, Dartanyan Deasis A 02/17/2013, 9:21 PM

## 2013-02-17 NOTE — Discharge Summary (Signed)
Physician Discharge Summary Note  Patient:  Darren [name] is an 38 y.o., male MRN:  [id number] DOB:  19-Jun-1975 Patient phone:  [phone] (home)  Patient address:   [address]  [address] [place] [address],  Total Time spent with patient: 30 minutes  Date of Admission:  02/09/2013 Date of Discharge: 02/18/13  Reason for Admission:  Auditory Hallucinations, Suicidal thoughts   Discharge Diagnoses: Principal Problem:   Paranoid schizophrenia Active Problems:   Psychosis   Depressive disorder, not elsewhere classified   Psychiatric Specialty Exam: Physical Exam  Review of Systems  Constitutional: Negative.   HENT: Negative.   Eyes: Negative.   Respiratory: Negative.   Cardiovascular: Negative.   Gastrointestinal: Negative.   Genitourinary: Negative.   Musculoskeletal: Negative.   Skin: Negative.   Neurological: Negative.   Endo/Heme/Allergies: Negative.   Psychiatric/Behavioral: Negative for depression, suicidal ideas, hallucinations, memory loss and substance abuse. The patient is not nervous/anxious and does not have insomnia.     Blood pressure 119/78, pulse 91, temperature 97.2 F (36.2 C), temperature source Oral, resp. rate 20, height 5\' 6"  (1.676 m), weight 63.05 kg (139 lb).Body mass index is 22.45 kg/(m^2).  General Appearance: Fairly Groomed  Patent attorney::  Good  Speech:  Clear and Coherent and Normal Rate  Volume:  Normal  Mood:  Euthymic  Affect:  Appropriate  Thought Process:  Goal Directed and Logical  Orientation:  Full (Time, Place, and Person)  Thought Content:  Negative  Suicidal Thoughts:  No  Homicidal Thoughts:  No  Memory:  Immediate;   Good Recent;   Good Remote;   Good  Judgement:  Intact  Insight:  Fair  Psychomotor Activity:  Normal  Concentration:  Good  Recall:  Good  Fund of Knowledge:Good  Language: Good  Akathisia:  No  Handed:  Right  AIMS (if indicated):     Assets:  Communication Skills Desire for  Improvement Physical Health Social Support  Sleep:  Number of Hours: 6.25    Past Psychiatric History: Yes  Diagnosis:Paranoid Schizophrenia   Hospitalizations: [facility] [place]   Outpatient Care: In [place]   Substance Abuse Care: Denies   Self-Mutilation: Denies  Suicidal Attempts: Several suicide attempts by hanging, overdose, and cutting wrist  Violent Behaviors: Denies    Musculoskeletal: Strength & Muscle Tone: within normal limits Gait & Station: normal Patient leans: N/A  DSM5:  Axis Diagnosis:  AXIS I: Paranoid schizophrenia  Unspecified depressive disorder  AXIS II: Deferred  AXIS III:  Past Medical History   Diagnosis  Date   .  Asthma    AXIS IV: economic problems, other psychosocial or environmental problems and problems related to social environment  AXIS V: 61-70 mild symptoms  Level of Care:  OP  Hospital Course:  Darren [name] is a 38 year old male who presented voluntarily to the [facility] after walking several miles in the cold to get help for suicidal thoughts and psychosis including command hallucinations. He reported having a long history of schizophrenia that was diagnosed in childhood and has been off medications for a year. In the ED patient reported attempting suicide by trying to hang himself with a belt. Patient states today during his admission assessment "I have been depressed and having mood swings. I am having a lot of stress like family issues and trying to get my children back. I think I will feel better when I get back on my Zyprexa. I moved here a few months ago from [facility]  to be closer to my family. I like it here and will probably stay. I have a wife who is a Education officer, environmentalpastor and I have support through the [employer]. I walked here in the cold for help because things were getting bad. I did not even try to get a ride because I don't like to bother people when I'm psychotic. I just kinda shut down." Darren NeedleMichael is cooperative during the  assessment but appears guarded when asked directly about his stressors. He reported that the place [place] up out of [place] "closed" his case but notes from epic reveal he had reported being discharged for missing appointments.   Patient reported to the treatment team that he had been previously treated with Zyprexa Zydis. He was started at 15 mg which was later reduced to 10 mg due to complaints of sedation. He was initially very guarded in opening up about his stressors especially those concerning his wife. Patient was noted to also appear very depressed so Wellbutrin 100 mg daily was added to his regimen. He talked a great deal about missing his family and worried that he would miss the birth of his child. Patient slowly became more active on the unit and began to interact more with peers. He appeared invested to follow up on an outpatient basis. Patient reported that he planned to stay in [place]. The patient expressed gratitude to be back on his psychotropic medication. He agreed to follow [facility] with [facility] for [facility] care. At the time of discharge the patient denied active SI/HI/AVH. He received prescriptions for his medications and a three day supply of sample medications.   Consults:  None  Significant Diagnostic Studies:  Admission labs completed to include Chemistry panel, CBC, UDS, Alcohol level   Discharge Vitals:   Blood pressure 119/78, pulse 91, temperature 97.2 F (36.2 C), temperature source Oral, resp. rate 20, height 5\' 6"  (1.676 m), weight 63.05 kg (139 lb). Body mass index is 22.45 kg/(m^2). Lab Results:   No results found for this or any previous visit (from the past 72 hour(s)).  Physical Findings: AIMS: Facial and Oral Movements Muscles of Facial Expression: None, normal Lips and Perioral Area: None, normal Jaw: None, normal Tongue: None, normal,Extremity Movements Upper (arms, wrists, hands, fingers): None, normal Lower (legs, knees, ankles, toes): None, normal,  Trunk Movements Neck, shoulders, hips: None, normal, Overall Severity Severity of abnormal movements (highest score from questions above): None, normal Incapacitation due to abnormal movements: None, normal, Dental Status Current problems with teeth and/or dentures?: No Does patient usually wear dentures?: No  CIWA:    COWS:     Psychiatric Specialty Exam: See Psychiatric Specialty Exam and Suicide Risk Assessment completed by Attending Physician prior to discharge.  Discharge destination:  Home  Is patient on multiple antipsychotic therapies at discharge:  No   Has Patient had three or more failed trials of antipsychotic monotherapy by history:  No  Recommended Plan for Multiple Antipsychotic Therapies: NA     Medication List    STOP taking these medications       penicillin v potassium 500 MG tablet  Commonly known as:  VEETID      TAKE these medications     Indication   buPROPion 100 MG tablet  Commonly known as:  WELLBUTRIN  Take 1 tablet (100 mg total) by mouth daily.   Indication:  Major Depressive Disorder     ibuprofen 600 MG tablet  Commonly known as:  ADVIL,MOTRIN  Take 1 tablet (600  mg total) by mouth every 6 (six) hours as needed.      multivitamin Tabs tablet  Take 1 tablet by mouth daily. Please purchase an over the counter vitamin at your local pharmacy if you wish to continue this treatment.   Indication:  Vitamin Supplementation     OLANZapine zydis 10 MG disintegrating tablet  Commonly known as:  ZYPREXA  Take 1 tablet (10 mg total) by mouth at bedtime.   Indication:  Schizophrenia     traZODone 50 MG tablet  Commonly known as:  DESYREL  Take 1 tablet (50 mg total) by mouth at bedtime as needed for sleep.   Indication:  Trouble Sleeping           Follow-up Information   Follow up with [facility]. (Go to the walk-in clinic between 8 and 9AM M-F for your hospital follow up appointment)    Contact information:   [address]  [place]   [phone]] [phone]      Follow-up recommendations:   Activity: as tolerated  Diet: healthy  Tests: routine blood work up  Other: patient to keep his after care appointment   Comments:   Take all your medications as prescribed by your mental healthcare provider.  Report any adverse effects and or reactions from your medicines to your outpatient provider promptly.  Patient is instructed and cautioned to not engage in alcohol and or illegal drug use while on prescription medicines.  In the event of worsening symptoms, patient is instructed to call the crisis hotline, 911 and or go to the nearest ED for appropriate evaluation and treatment of symptoms.  Follow-up with your primary care provider for your other medical issues, concerns and or health care needs.   Total Discharge Time:  Greater than 30 minutes.  SignedFransisca Kaufmann NP-C 02/18/2013, 8:55 AM  Patient seen, evaluated and I agree with notes by Nurse Practitioner. Thedore Mins, MD

## 2013-02-17 NOTE — [facility] Group Notes (Addendum)
[facility] Note   02/17/2013 8:45 AM  Participation Quality:  Alert, Appropriate and Oriented  Mood/Affect:  Calm  Depression Rating:  1  Anxiety Rating:  1  Thoughts of Suicide:  Pt denies SI/HI  Will you contract for safety?   Yes  Current AVH:  Pt denies  Plan for Discharge/Comments:  Pt attended discharge planning group and actively participated in group.  CSW provided pt with today's workbook.  Pt reports sleeping well yesterday and is ready for discharge today.  Pt states that his pastor will pick him up and return home.  No further needs voiced by pt at this time.    Transportation Means: Pt reports access to transportation - pastor will pick pt up  Supports: No supports mentioned at this time  Darren IvanChelsea Horton, LCSW 02/17/2013 10:08 AM

## 2013-02-17 NOTE — Tx Team (Signed)
  Interdisciplinary Treatment Plan Update   Date Reviewed:  02/17/2013  Time Reviewed:  10:39 AM  Progress in Treatment:   Attending groups: Yes Participating in groups: Yes Taking medication as prescribed: Yes  Tolerating medication: Yes Family/Significant other contact made: Yes  Patient understands diagnosis: Yes  Discussing patient identified problems/goals with staff: Yes Medical problems stabilized or resolved: Yes Denies suicidal/homicidal ideation: Yes Patient has not harmed self or others: Yes  For review of initial/current patient goals, please see plan of care.  Estimated Length of Stay: D/C today   Reason for Continuation of Hospitalization:   New Problems/Goals identified:  N/A  Discharge Plan or Barriers:   return home, follow up outpt  Additional Comments:    Attendees:  Signature: Thedore MinsMojeed Akintayo, MD 02/17/2013 10:39 AM   Signature: Richelle Itood Edwyna Dangerfield, LCSW 02/17/2013 10:39 AM  Signature: Fransisca KaufmannLaura Davis, NP 02/17/2013 10:39 AM  Signature: Joslyn Devonaroline Beaudry, RN 02/17/2013 10:39 AM  Signature: Liborio NixonPatrice White, RN 02/17/2013 10:39 AM  Signature:  02/17/2013 10:39 AM  Signature:   02/17/2013 10:39 AM  Signature:    Signature:    Signature:    Signature:    Signature:    Signature:      Scribe for Treatment Team:   Richelle Itood Graeme Menees, LCSW  02/17/2013 10:39 AM

## 2013-02-17 NOTE — Progress Notes (Signed)
[facility] Adult [facility] Management Discharge Plan :  Will you be returning to the same living situation after discharge: Yes,  girlfriend At discharge, do you have transportation home?:Yes,  girlfriend Do you have the ability to pay for your medications:Yes,  [facility]  Release of information consent forms completed and in the chart;  Patient's signature needed at discharge.  Patient to Follow up at: Follow-up Information   Follow up with [facility]. (Go to the walk-in clinic between 8 and 9AM M-F for your hospital follow up appointment)    Contact information:   [address]  [place]  [phone]] [phone]      Patient denies SI/HI:   Yes,  Yes    Safety Planning and Suicide Prevention discussed:  Yes,  Yes  Simona HuhYang, Tina 02/17/2013, 10:40 AM

## 2013-02-18 NOTE — Progress Notes (Signed)
Patient ID: Darren [name], male   DOB: [birth year], 38 y.o.   MRN: [id number] Pt. Discharged per MD orders;  PT. Currently denies any HI/SI or AVH.  Pt. Was given education regarding follow up appointments and medications by RN.  Pt. Denies any questions or concerns about the medications.  Pt. Was escorted to the search room to retrieve his/her belongings by RN before being discharged to the hospital lobby.

## 2013-02-18 NOTE — BHH Suicide Risk Assessment (Signed)
   Demographic Factors:  Male, Low socioeconomic status and lives with his family  Total Time spent with patient: 15 minutes  Psychiatric Specialty Exam: Physical Exam  Psychiatric: He has a normal mood and affect. His speech is normal and behavior is normal. Judgment and thought content normal. Cognition and memory are normal.    Review of Systems  Constitutional: Negative.   HENT: Negative.   Eyes: Negative.   Respiratory: Negative.   Cardiovascular: Negative.   Gastrointestinal: Negative.   Genitourinary: Negative.   Musculoskeletal: Negative.   Skin: Negative.   Neurological: Negative.   Endo/Heme/Allergies: Negative.   Psychiatric/Behavioral: Negative.     Blood pressure 131/77, pulse 93, temperature 97.2 F (36.2 C), temperature source Oral, resp. rate 16, height 5\' 6"  (1.676 m), weight 63.05 kg (139 lb).Body mass index is 22.45 kg/(m^2).  General Appearance: Fairly Groomed  Patent attorneyye Contact::  Good  Speech:  Clear and Coherent and Normal Rate  Volume:  Normal  Mood:  Euthymic  Affect:  Appropriate  Thought Process:  Goal Directed and Logical  Orientation:  Full (Time, Place, and Person)  Thought Content:  Negative  Suicidal Thoughts:  No  Homicidal Thoughts:  No  Memory:  Immediate;   Fair Recent;   Fair Remote;   Fair  Judgement:  Intact  Insight:  Fair  Psychomotor Activity:  Normal  Concentration:  Good  Recall:  Good  Fund of Knowledge:Good  Language: Good  Akathisia:  No  Handed:  Right  AIMS (if indicated):     Assets:  Communication Skills Physical Health Social Support  Sleep:  Number of Hours: 6.25    Musculoskeletal: Strength & Muscle Tone: within normal limits Gait & Station: normal Patient leans: N/A   Mental Status Per Nursing Assessment::   On Admission:     Current Mental Status by Physician: patient denies suicidal ideation, intent ot plan.  Loss Factors: Financial problems/change in socioeconomic status  Historical  Factors: Impulsivity  Risk Reduction Factors:   Sense of responsibility to family and Living with another person, especially a relative  Continued Clinical Symptoms:  Resolution of delusions and psychosis  Cognitive Features That Contribute To Risk:  Closed-mindedness Polarized thinking    Suicide Risk:  Minimal: No identifiable suicidal ideation.  Patients presenting with no risk factors but with morbid ruminations; may be classified as minimal risk based on the severity of the depressive symptoms  Discharge Diagnoses:   AXIS I:  Paranoid schizophrenia              Unspecified depressive disorder AXIS II:  Deferred AXIS III:   Past Medical History  Diagnosis Date  . Asthma    AXIS IV:  other psychosocial or environmental problems and problems related to social environment AXIS V:  61-70 mild symptoms  Plan Of Care/Follow-up recommendations:  Activity:  as tolerated Diet:  healthy Tests:  routine Other:  patient to keep his after care appointment  Is patient on multiple antipsychotic therapies at discharge:  No   Has Patient had three or more failed trials of antipsychotic monotherapy by history:  No  Recommended Plan for Multiple Antipsychotic Therapies: NA    [name], Kamden Reber, MD 02/18/2013, 9:16 AM

## 2013-02-18 NOTE — Progress Notes (Signed)
Patient ID: Darren [name], male   DOB: [birth year], 38 y.o.   MRN: [id number] D: Patient in dayroom on approach. Pt mood and affect was anxious otherwise appropriate. Pt stated not sure when his family member will be here to pick him up. Pt denies SI/HI/AVH and pain. Pt attended evening wrap up group and Interacted appropriately with peers. Pt denies any needs or concerns.  Cooperative with assessment. No acute distressed noted at this time.   A: Met with pt 1:1. Medications administered as prescribed. Writer encouraged pt to discuss feelings. Pt encouraged to come to staff with any question or concerns.   R: Patient remains safe. He is complaint with medications and denies any adverse reaction. Continue current POC.

## 2013-02-21 ENCOUNTER — Emergency Department (HOSPITAL_COMMUNITY)
Admission: EM | Admit: 2013-02-21 | Discharge: 2013-02-21 | Disposition: A | Discharge status: Home / Self Care | Payer: Medicaid Other | Attending: Emergency Medicine | Admitting: Emergency Medicine

## 2013-02-21 ENCOUNTER — Encounter (HOSPITAL_COMMUNITY): Payer: Self-pay | Admitting: Emergency Medicine

## 2013-02-21 DIAGNOSIS — B001 Herpesviral vesicular dermatitis: Secondary | ICD-10-CM

## 2013-02-21 DIAGNOSIS — K089 Disorder of teeth and supporting structures, unspecified: Secondary | ICD-10-CM | POA: Insufficient documentation

## 2013-02-21 DIAGNOSIS — K0889 Other specified disorders of teeth and supporting structures: Secondary | ICD-10-CM

## 2013-02-21 DIAGNOSIS — Z880 Allergy status to penicillin: Secondary | ICD-10-CM | POA: Insufficient documentation

## 2013-02-21 DIAGNOSIS — Z79899 Other long term (current) drug therapy: Secondary | ICD-10-CM | POA: Insufficient documentation

## 2013-02-21 DIAGNOSIS — F200 Paranoid schizophrenia: Secondary | ICD-10-CM | POA: Insufficient documentation

## 2013-02-21 DIAGNOSIS — K029 Dental caries, unspecified: Secondary | ICD-10-CM | POA: Insufficient documentation

## 2013-02-21 DIAGNOSIS — J45909 Unspecified asthma, uncomplicated: Secondary | ICD-10-CM | POA: Insufficient documentation

## 2013-02-21 DIAGNOSIS — J00 Acute nasopharyngitis [common cold]: Secondary | ICD-10-CM | POA: Insufficient documentation

## 2013-02-21 MED ORDER — VALACYCLOVIR HCL 1 G PO TABS: 1000.0000 mg | ORAL_TABLET | Freq: Three times a day (TID) | ORAL | Status: AC

## 2013-02-21 MED ORDER — HYDROCODONE-ACETAMINOPHEN 5-325 MG PO TABS: 1.0000 | ORAL_TABLET | Freq: Four times a day (QID) | ORAL | Status: DC | PRN

## 2013-02-21 MED ORDER — CLINDAMYCIN HCL 150 MG PO CAPS: 300.0000 mg | ORAL_CAPSULE | Freq: Four times a day (QID) | ORAL | Status: DC

## 2013-02-21 NOTE — ED Provider Notes (Signed)
CSN: [id number]     Arrival date & time 02/21/13  1534 History   This chart was scribed for Arthor Captain, PA-C, working with Darlys Gales, MD by Blanchard Kelch, ED Scribe. This patient was seen in room TR04C/TR04C and the patient's care was started at 5:08 PM.       Chief Complaint  Patient presents with  . Dental Pain    Patient is a 38 y.o. male presenting with tooth pain. The history is provided by the patient. No language interpreter was used.  Dental Pain Associated symptoms: no fever     HPI Comments: Darren [name] is a 38 y.o. male who presents to the Emergency Department complaining of constant, unchanged, moderate right lower dental pain that began about three days ago. He denies alleviating factors. He also reports having a cold sore on his left lower lip that appeared about three days ago. He states that he has noticed discharge from the area.     Past Medical History  Diagnosis Date  . Asthma   . Paranoid schizophrenia    Past Surgical History  Procedure Laterality Date  . Hernia repair     No family history on file. History  Substance Use Topics  . Smoking status: Never Smoker   . Smokeless tobacco: Not on file  . Alcohol Use: No    Review of Systems  Constitutional: Negative for fever and chills.  HENT: Positive for dental problem.     Allergies  Penicillins  Home Medications   Current Outpatient Rx  Name  Route  Sig  Dispense  Refill  . buPROPion (WELLBUTRIN) 100 MG tablet   Oral   Take 1 tablet (100 mg total) by mouth daily.   30 tablet   0   . ibuprofen (ADVIL,MOTRIN) 600 MG tablet   Oral   Take 1 tablet (600 mg total) by mouth every 6 (six) hours as needed.   30 tablet   0   . multivitamin (PROSIGHT) TABS tablet   Oral   Take 1 tablet by mouth daily. Please purchase an over the counter vitamin at your local pharmacy if you wish to continue this treatment.   30 each   0   . OLANZapine zydis (ZYPREXA) 10 MG disintegrating tablet  Oral   Take 1 tablet (10 mg total) by mouth at bedtime.   30 tablet   0   . traZODone (DESYREL) 50 MG tablet   Oral   Take 1 tablet (50 mg total) by mouth at bedtime as needed for sleep.   30 tablet   0    Triage Vitals: BP 122/72  Pulse 92  Temp(Src) 98.1 F (36.7 C) (Oral)  Resp 18  SpO2 96%  Physical Exam  Nursing note and vitals reviewed. Constitutional: He is oriented to person, place, and time. He appears well-developed and well-nourished. No distress.  HENT:  Head: Normocephalic and atraumatic.  Right lower third molar decayed down to gum line. No abscess or drainage noted.   Eyes: EOM are normal.  Neck: Neck supple. No tracheal deviation present.  Cardiovascular: Normal rate.   Pulmonary/Chest: Effort normal. No respiratory distress.  Musculoskeletal: Normal range of motion.  Neurological: He is alert and oriented to person, place, and time.  Skin: Skin is warm and dry.  Psychiatric: He has a normal mood and affect. His behavior is normal.    ED Course  Procedures (including critical care time)  DIAGNOSTIC STUDIES: Oxygen Saturation is 96% on room air, normal by  my interpretation.    COORDINATION OF CARE: 5:11 PM -Will order medication for dental infection and cold sore. Will provide name for referral to dentist. Patient verbalizes understanding and agrees with treatment plan.    Labs Review Labs Reviewed - No data to display Imaging Review No results found.  EKG Interpretation   None       MDM   1. Pain, dental   2. Cold sore    Patient with toothache.  No gross abscess.  Exam unconcerning for Ludwig's angina or spread of infection.  Will treat with penicillin and pain medicine.  Urged patient to follow-up with dentist.    I personally performed the services described in this documentation, which was scribed in my presence. The recorded information has been reviewed and is accurate.     Arthor Captainbigail Antia Rahal, PA-C 02/22/13 1349

## 2013-02-21 NOTE — Discharge Instructions (Signed)
You have been diagnosed with Dental pain. Please call the follow up dentist first thing in the morning on Monday for a follow up appointment. Keep your discharge paperwork from today's visit to bring to the dentist office. You may also use the resource guide listed below to help you find a dentist if you do not already have one to followup with. It is very important that you get evaluated by a dentist as soon as possible.  Use your pain medication as prescribed and do not operate heavy machinery while on pain medication. Note that your pain medication contains acetaminophen (Tylenol) & its is not reccommended that you use additional acetaminophen (Tylenol) while taking this medication. Take your full course of antibiotics. Read the instructions below.  Eat a soft or liquid diet and rinse your mouth out after meals with warm water. You should see a dentist or return here at once if you have increased swelling, increased pain or uncontrolled bleeding from the site of your injury.   Cold Sore A cold sore (fever blister) is a skin infection caused by the herpes simplex virus (HSV-1). HSV-1 is closely related to the virus that causes gential herpes (HSV-2), but they are not the same even though both viruses can cause oral and genital infections. Cold sores are small, fluid-filled sores inside of the mouth or on the lips, gums, nose, chin, cheeks, or fingers.  The herpes simplex virus can be easily passed (contagious) to other people through close personal contact, such as kissing or sharing personal items. The virus can also spread to other parts of the body, such as the eyes or genitals. Cold sores are contagious until the sores crust over completely. They often heal within 2 weeks.  Once a person is infected, the herpes simplex virus remains permanently in the body. Therefore, there is no cure for cold sores, and they often recur when a person is tired, stressed, sick, or gets too much sun. Additional factors  that can cause a recurrence include hormone changes in menstruation or pregnancy, certain drugs, and cold weather.  CAUSES  Cold sores are caused by the herpes simplex virus. The virus is spread from person to person through close contact, such as through kissing, touching the affected area, or sharing personal items such as lip balm, razors, or eating utensils.  SYMPTOMS  The first infection may not cause symptoms. If symptoms develop, the symptoms often go through different stages. Here is how a cold sore develops:   Tingling, itching, or burning is felt 1 2 days before the outbreak.   Fluid-filled blisters appear on the lips, inside the mouth, nose, or on the cheeks.   The blisters start to ooze clear fluid.   The blisters dry up and a yellow crust appears in its place.   The crust falls off.  Symptoms depend on whether it is the initial outbreak or a recurrence. Some other symptoms with the first outbreak may include:   Fever.   Sore throat.   Headache.   Muscle aches.   Swollen neck glands.  DIAGNOSIS  A diagnosis is often made based on your symptoms and looking at the sores. Sometimes, a sore may be swabbed and then examined in the lab to make a final diagnosis. If the sores are not present, blood tests can find the herpes simplex virus.  TREATMENT  There is no cure for cold sores and no vaccine for the herpes simplex virus. Within 2 weeks, most cold sores go away on  their own without treatment. Medicines cannot make the infection go away, but medicine can help relieve some of the pain associated with the sores, can work to stop the virus from multiplying, and can also shorten healing time. Medicine may be in the form of creams, gels, pills, or a shot.  HOME CARE INSTRUCTIONS   Only take over-the-counter or prescription medicines for pain, discomfort, or fever as directed by your caregiver. Do not use aspirin.   Use a cotton-tip swab to apply creams or gels to your  sores.   Do not touch the sores or pick the scabs. Wash your hands often. Do not touch your eyes without washing your hands first.   Avoid kissing, oral sex, and sharing personal items until sores heal.   Apply an ice pack on your sores for 10 15 minutes to ease any discomfort.   Avoid hot, cold, or salty foods because they may hurt your mouth. Eat a soft, bland diet to avoid irritating the sores. Use a straw to drink if you have pain when drinking out of a glass.   Keep sores clean and dry to prevent an infection of other tissues.   Avoid the sun and limit stress if these things trigger outbreaks. If sun causes cold sores, apply sunscreen on the lips before being out in the sun.  SEEK MEDICAL CARE IF:   You have a fever or persistent symptoms for more than 2 3 days.   You have a fever and your symptoms suddenly get worse.   You have pus, not clear fluid, coming from the sores.   You have redness that is spreading.   You have pain or irritation in your eye.   You get sores on your genitals.   Your sores do not heal within 2 weeks.   You have a weakened immune system.   You have frequent recurrences of cold sores.  MAKE SURE YOU:   Understand these instructions.  Will watch your condition.  Will get help right away if you are not doing well or get worse. Document Released: 12/29/1999 Document Revised: 09/25/2011 Document Reviewed: 05/15/2011 [facility] Patient Information 2014 [place], [place].  SEEK MEDICAL CARE IF:   You have increased pain not controlled with medicines.   You have swelling around your tooth, in your face or neck.   You have bleeding which starts, continues, or gets worse.   You have a fever >101  If you are unable to open your mouth Soft Diet  The soft diet may be recommended after you were put on a full liquid diet. A normal diet may follow. The soft diet can also be used after surgery if you are too ill to keep down a normal  diet. The soft diet may also be needed if you have a hard time chewing foods.  DESCRIPTION  Tender foods are used. Foods do not need to be ground or pureed. Most raw fruits and vegetables and coarse breads and cereals should be avoided. Fried foods and highly seasoned foods may cause discomfort.  NUTRITIONAL ADEQUACY  A healthy diet is possible if foods from each of the basic food groups are eaten daily.  SOFT DIET FOOD LISTS  Milk/Dairy  Allowed: Milk and milk drinks, milk shakes, cream cheese, cottage cheese, mild cheeses.  Avoid: Sharp or highly seasoned cheese. Meat/Meat Substitutes  Allowed: Broiled, roasted, baked, or stewed tender lean beef, mutton, lamb, veal, chicken, [place], liver, ham, crisp bacon, white fish, tuna, salmon. Eggs, smooth peanut butter.  Avoid: All fried meats, fish, or fowl. Rich gravies and sauces. Lunch meats, sausages, hot dogs. Meats with gristle, chunky peanut butter. Breads/Grains  Allowed: Rice, noodles, spaghetti, macaroni. Dry or cooked refined cereals, such as farina, cream of wheat, oatmeal, grits, whole-wheat cereals. Plain or toasted white or wheat blend or whole-grain breads, soda crackers or saltines, flour tortillas.  Avoid: Wild rice, coarse cereals, such as bran. Seed in or on breads and crackers. Bread or bread products with nuts or seeds. Fruits/Vegetables  Allowed: Fruit and vegetable juices, well-cooked or canned fruits and vegetables, any dried fruit. One citrus fruit daily, 1 vitamin A source daily. Well-ripened, easy to chew fruits, sweet potatoes. Baked, boiled, mashed, creamed, scalloped, or au gratin potatoes. Broths or creamed soups made with allowed vegetables, strained tomatoes.  Avoid: All gas-forming vegetables (corn, radishes, Brussels sprouts, onions, broccoli, cabbage, parsnips, turnips, chili peppers, pinto beans, split peas, dried beans). Fruits containing seeds and skin. Potato chips and corn chips. All others that are not made with  allowed vegetables. Highly seasoned soups. Desserts/Sweets  Allowed: Simple desserts, such as custard, junkets, gelatin desserts, plain ice cream and sherbets, simple cakes and cookies, allowed fruits, sugar, syrup, jelly, honey, plain hard candy, and molasses.  Avoid: Rich pastries, any dessert containing dates, nuts, raisins, or coconut. Fried pastries, such as doughnuts. Chocolate. Beverages  Allowed: Fruit and vegetable juices. Caffeine-free carbonated drinks, coffee, and tea.  Avoid: Caffeinated beverages: coffee, tea, soda or pop. Miscellaneous  Allowed: Butter, cream, margarine, mayonnaise, oil. Cream sauces, salt, and mild spices.  Avoid: Highly spiced salad dressings. Highly seasoned foods, hot sauce, mustard, horseradish, and pepper. SAMPLE MENU  Breakfast  Orange juice.  Oatmeal.  Soft cooked egg.  Toast and margarine.  2% milk.  Coffee. Lunch  Meatloaf.  Mashed potato.  Green beans.  Lemon pudding.  Bread and margarine.  Coffee. Dinner  Consomm or apricot nectar.  Chicken breast.  Rice, peas, and carrots.  Applesauce.  Bread and margarine.  2% milk. To cut the amount of fat in your diet, omit margarine and use 1% or skim milk.  NUTRIENT ANALYSIS  Calories........................1953 Kcal.  Protein.........................102 gm.  Carbohydrate...............247 gm.  Fat................................65 gm.  Cholesterol...................449 mg.  Dietary fiber.................19 gm.  Vitamin A.....................2944 RE.  Vitamin C.....................79 mg.  Niacin..........................25 mg.  Riboflavin....................2.0 mg.  Thiamin.......................1.5 mg.  Folate..........................249 mcg.  Calcium.......................1030 mg.  Phosphorus.................1782 mg.  Zinc..............................12 mg.  Iron..............................13 mg.  Sodium.........................299 mg.  Potassium....................3046  mg. Document Released: 04/09/2007 Document Revised: 03/25/2011 Document Reviewed: 04/09/2007  [facility] Patient Information 2014 [place], [place].   RESOURCE GUIDE   Dental Problems  Dr. Grandville Silos $200 dollar visit [address] [place], [place] [address]  [phone]    Patients with Medicaid: [facility] [phone] [address]                                           [phone] [address] Phone:  [phone]                                                  Phone:  [phone]  If unable  to pay or uninsured, contact:  [facility] or [facility]. to become qualified for the adult dental clinic.  Chronic Pain Problems Contact [facility]  [phone] Patients need to be referred by their primary care doctor.  Insufficient Money for Medicine Contact United Way:  call "211" or [facility] [phone].  No Primary Care Doctor Call [facility]  [phone] Other agencies that provide inexpensive medical care    [facility]  [phone]    [facility]  [phone]    [facility]  ([phone]    [facility]  [phone]    [facility]  [phone]    [facility]  [phone]  Psychological Services [facility]  [phone] [facility] Services  [phone] [facility]   [phone] (emergency services [phone])  Substance Abuse Resources [facility] and Drug Services  [phone] [facility] (316)166-7669 [facility] (769)273-1707 Floydene Flock [phone] Residential & Outpatient Substance Abuse Program  ([phone]  Abuse/Neglect [facility] Child Abuse Hotline 8485046464 [facility] Child Abuse Hotline 989 224 7080 (After Hours)  Emergency Shelter [facility] [phone]  Maternity Homes [facility] [place] [facility] [phone] [facility] Services  [phone]  MRSA Hotline #:   223-695-0890    [facility] Resources  [facility]     [employer]                          [facility] Dept. [address] [place]                       [address]      [address] [place] [address]  Blondell Reveal Phone:  [phone]                                   Phone:  [phone]                 Phone:  [phone]  [facility] Mental Health Phone:  [phone]  [facility] Child Abuse Hotline 860-696-5151 [phone] (After Hours)

## 2013-02-21 NOTE — ED Notes (Signed)
Pt has cold sore to left lower lip and pain dental pain to right lower molar.

## 2013-02-22 NOTE — ED Provider Notes (Signed)
Medical screening examination/treatment/procedure(s) were performed by non-physician practitioner and as supervising physician I was immediately available for consultation/collaboration.  EKG Interpretation   None         [name] Masneri, MD 02/22/13 1506 

## 2013-02-23 NOTE — Progress Notes (Signed)
Patient Discharge Instructions:  After Visit Summary (AVS):   Faxed to:  02/23/13 Discharge Summary Note:   Faxed to:  02/23/13 Psychiatric Admission Assessment Note:   Faxed to:  02/23/13 Suicide Risk Assessment - Discharge Assessment:   Faxed to:  02/23/13 Faxed/Sent to the Next Level Care provider:  02/23/13 [facility] to [facility] @ [phone]  Jerelene ReddenSheena E Hastings, 02/23/2013, 4:09 PM

## 2013-04-03 ENCOUNTER — Encounter (HOSPITAL_COMMUNITY): Payer: Self-pay | Admitting: Emergency Medicine

## 2013-04-03 ENCOUNTER — Emergency Department (HOSPITAL_COMMUNITY)
Admission: EM | Admit: 2013-04-03 | Discharge: 2013-04-03 | Disposition: A | Discharge status: Other Acute Inpatient Hospital | Payer: Medicaid Other | Attending: Emergency Medicine | Admitting: Emergency Medicine

## 2013-04-03 ENCOUNTER — Inpatient Hospital Stay: Payer: Self-pay | Admitting: Psychiatry

## 2013-04-03 DIAGNOSIS — F3289 Other specified depressive episodes: Secondary | ICD-10-CM | POA: Insufficient documentation

## 2013-04-03 DIAGNOSIS — IMO0002 Reserved for concepts with insufficient information to code with codable children: Secondary | ICD-10-CM | POA: Insufficient documentation

## 2013-04-03 DIAGNOSIS — X80XXXA Intentional self-harm by jumping from a high place, initial encounter: Secondary | ICD-10-CM | POA: Insufficient documentation

## 2013-04-03 DIAGNOSIS — R000 Tachycardia, unspecified: Secondary | ICD-10-CM | POA: Insufficient documentation

## 2013-04-03 DIAGNOSIS — F209 Schizophrenia, unspecified: Secondary | ICD-10-CM

## 2013-04-03 DIAGNOSIS — Y9289 Other specified places as the place of occurrence of the external cause: Secondary | ICD-10-CM

## 2013-04-03 DIAGNOSIS — R45851 Suicidal ideations: Secondary | ICD-10-CM

## 2013-04-03 DIAGNOSIS — F329 Major depressive disorder, single episode, unspecified: Secondary | ICD-10-CM | POA: Insufficient documentation

## 2013-04-03 DIAGNOSIS — Z79899 Other long term (current) drug therapy: Secondary | ICD-10-CM | POA: Insufficient documentation

## 2013-04-03 DIAGNOSIS — F200 Paranoid schizophrenia: Secondary | ICD-10-CM | POA: Insufficient documentation

## 2013-04-03 DIAGNOSIS — H538 Other visual disturbances: Secondary | ICD-10-CM | POA: Insufficient documentation

## 2013-04-03 DIAGNOSIS — Z880 Allergy status to penicillin: Secondary | ICD-10-CM | POA: Insufficient documentation

## 2013-04-03 LAB — CBC
HEMATOCRIT: 45.4 % (ref 39.0–52.0)
HEMOGLOBIN: 16 g/dL (ref 13.0–17.0)
MCH: 30 pg (ref 26.0–34.0)
MCHC: 35.2 g/dL (ref 30.0–36.0)
MCV: 85.2 fL (ref 78.0–100.0)
Platelets: 155 10*3/uL (ref 150–400)
RBC: 5.33 MIL/uL (ref 4.22–5.81)
RDW: 13.1 % (ref 11.5–15.5)
WBC: 6.7 10*3/uL (ref 4.0–10.5)

## 2013-04-03 LAB — ACETAMINOPHEN LEVEL

## 2013-04-03 LAB — RAPID URINE DRUG SCREEN, HOSP PERFORMED
Amphetamines: NOT DETECTED
BARBITURATES: NOT DETECTED
Benzodiazepines: NOT DETECTED
Cocaine: NOT DETECTED
Opiates: NOT DETECTED
Tetrahydrocannabinol: NOT DETECTED

## 2013-04-03 LAB — COMPREHENSIVE METABOLIC PANEL
ALK PHOS: 54 U/L (ref 39–117)
ALT: 19 U/L (ref 0–53)
AST: 31 U/L (ref 0–37)
Albumin: 4 g/dL (ref 3.5–5.2)
BILIRUBIN TOTAL: 0.2 mg/dL — AB (ref 0.3–1.2)
BUN: 21 mg/dL (ref 6–23)
CO2: 27 mEq/L (ref 19–32)
Calcium: 9.3 mg/dL (ref 8.4–10.5)
Chloride: 101 mEq/L (ref 96–112)
Creatinine, Ser: 1.39 mg/dL — ABNORMAL HIGH (ref 0.50–1.35)
GFR, EST AFRICAN AMERICAN: 74 mL/min — AB (ref >90)
GFR, EST NON AFRICAN AMERICAN: 64 mL/min — AB (ref >90)
GLUCOSE: 99 mg/dL (ref 70–99)
POTASSIUM: 3.7 meq/L (ref 3.7–5.3)
Sodium: 142 mEq/L (ref 137–147)
Total Protein: 7.9 g/dL (ref 6.0–8.3)

## 2013-04-03 LAB — SALICYLATE LEVEL: Salicylate Lvl: <2 mg/dL — ABNORMAL LOW (ref 2.8–20.0)

## 2013-04-03 LAB — ETHANOL: Alcohol, Ethyl (B): <11 mg/dL (ref 0–11)

## 2013-04-03 MED ORDER — ALUM & MAG HYDROXIDE-SIMETH 200-200-20 MG/5ML PO SUSP: 30.0000 mL | ORAL | Status: DC | PRN

## 2013-04-03 MED ORDER — LORAZEPAM 1 MG PO TABS: 1.0000 mg | ORAL_TABLET | Freq: Three times a day (TID) | ORAL | Status: DC | PRN

## 2013-04-03 MED ORDER — NICOTINE 21 MG/24HR TD PT24: 21.0000 mg | MEDICATED_PATCH | Freq: Every day | TRANSDERMAL | Status: DC | PRN

## 2013-04-03 MED ORDER — OLANZAPINE 10 MG PO TBDP: 10.0000 mg | ORAL_TABLET | Freq: Every day | ORAL | Status: DC

## 2013-04-03 MED ORDER — BUPROPION HCL 100 MG PO TABS: 100.0000 mg | ORAL_TABLET | Freq: Every day | ORAL | Status: DC

## 2013-04-03 MED ORDER — TRAZODONE HCL 50 MG PO TABS
50.0000 mg | ORAL_TABLET | Freq: Every evening | ORAL | Status: DC | PRN
Start: 2013-04-03 — End: 2013-04-03

## 2013-04-03 MED ORDER — ACETAMINOPHEN 325 MG PO TABS
650.0000 mg | ORAL_TABLET | ORAL | Status: DC | PRN
  Administered 2013-04-03: 650 mg via ORAL
  Filled 2013-04-03: qty 2

## 2013-04-03 MED ORDER — BUPROPION HCL 100 MG PO TABS
100.0000 mg | ORAL_TABLET | Freq: Once | ORAL | Status: AC
  Administered 2013-04-03: 100 mg via ORAL
  Filled 2013-04-03: qty 1

## 2013-04-03 NOTE — Progress Notes (Addendum)
Per [name] pt has been [facility] by Dr. Jennet MaduroPucilowska to room 311a Behavioral Med.  Report number is [phone].  Will update pt's nurse with information.  Pt will [facility] by [facility] [employer].   Tomi BambergerMariya Kebin Maye Disposition MHT

## 2013-04-03 NOTE — ED Notes (Signed)
C/o suicidal ideation with a plan to overdose .  Pt states he is having visual and auditory hallucinations.  Pt relates symptoms to an increase in his medication dosages.  Also c/o abrasion to L arm.  States he hit it on a railing on the way to the ED.

## 2013-04-03 NOTE — ED Notes (Signed)
Telepsych being conducted with sitter present in room.

## 2013-04-03 NOTE — ED Notes (Signed)
[facility] and patient moved to Pod C room 21 via Wheelchair

## 2013-04-03 NOTE — ED Notes (Signed)
Notified Claren[facility] DukeMcManus that PT needs psych holding meds ordered and something for pain. Allergy: ibuprofen - rxn-itching. When asked what PT takes at home for pain he states "Advil"

## 2013-04-03 NOTE — ED Notes (Signed)
Called Miriam RN again to give her our number to call back so we could give report

## 2013-04-03 NOTE — Progress Notes (Signed)
The following facilities have been contacted regarding inptx:  [facility]- per [name] beds available, demographics and brief overview of pt presenting probs given as well as referral faxed [facility]- per Wylene MenLacey at capacity, no adult beds available until Wed 3.25 [facility], referral faxed [facility]- per Dorathy DaftKayla at capacity [facility]- per [facility]ollie Scal[facility]ivia at capacity [facility] per Raynelle FanningJulie can fax, referral faxed [facility]- per [facility]- per Jill [facility]in only male beds available [facility]- per Amy at [facility]- per Donora[facility] only 55+ beds available [facility]- per Dannielle Huhanny at capacity [facility]- per [facility] wait list only [facility]- [facility] Lat[facility]ky at [facility]- at capacity per Katherina MiresJenny   Sharmila Wrobleski Disposition MHT

## 2013-04-03 NOTE — Discharge Instructions (Signed)
Transfer to [facility].

## 2013-04-03 NOTE — ED Notes (Signed)
Called [facility] twice because fax number not working. Faxing to a new number and sending hardcopy with PT to facility

## 2013-04-03 NOTE — ED Provider Notes (Signed)
CSN: [id number]     Arrival date & time 04/03/13  0129 History   First MD Initiated Contact with Patient 04/03/13 0240     Chief Complaint  Patient presents with  . Suicidal     (Consider location/radiation/quality/duration/timing/severity/associated sxs/prior Treatment) HPI History provided by patient. History of paranoid schizophrenia, states is taking medications as prescribed but feels like they're making his hallucinations worse. Feeling suicidal tonight, states he jumped off a second-story balcony, sustained abrasion to his left arm - he denies any other pain or trauma. He denies any alcohol or drug use. Symptoms moderate in severity. Past Medical History  Diagnosis Date  . Asthma   . Paranoid schizophrenia    Past Surgical History  Procedure Laterality Date  . Hernia repair     No family history on file. History  Substance Use Topics  . Smoking status: Never Smoker   . Smokeless tobacco: Not on file  . Alcohol Use: No    Review of Systems  Constitutional: Negative for fever and chills.  Eyes: Positive for visual disturbance.  Respiratory: Negative for shortness of breath.   Cardiovascular: Negative for chest pain.  Gastrointestinal: Negative for vomiting and abdominal pain.  Genitourinary: Negative for dysuria.  Musculoskeletal: Negative for back pain and neck pain.  Skin: Negative for rash.  Neurological: Negative for headaches.  All other systems reviewed and are negative.      Allergies  Ibuprofen and Penicillins  Home Medications   Current Outpatient Rx  Name  Route  Sig  Dispense  Refill  . albuterol (PROVENTIL HFA;VENTOLIN HFA) 108 (90 BASE) MCG/ACT inhaler   Inhalation   Inhale into the lungs every 6 (six) hours as needed for wheezing or shortness of breath.         Marland Kitchen buPROPion (WELLBUTRIN) 100 MG tablet   Oral   Take 1 tablet (100 mg total) by mouth daily.   30 tablet   0   . multivitamin (PROSIGHT) TABS tablet   Oral   Take 1 tablet by  mouth daily. Please purchase an over the counter vitamin at your local pharmacy if you wish to continue this treatment.   30 each   0   . OLANZapine zydis (ZYPREXA) 10 MG disintegrating tablet   Oral   Take 1 tablet (10 mg total) by mouth at bedtime.   30 tablet   0   . traZODone (DESYREL) 50 MG tablet   Oral   Take 1 tablet (50 mg total) by mouth at bedtime as needed for sleep.   30 tablet   0    BP 110/74  Pulse 71  Temp(Src) 97.8 F (36.6 C) (Oral)  Resp 20  Ht 5\' 3"  (1.6 m)  Wt 144 lb 8 oz (65.545 kg)  BMI 25.60 kg/m2  SpO2 90% Physical Exam  Constitutional: He is oriented to person, place, and time. He appears well-developed and well-nourished.  HENT:  Head: Normocephalic and atraumatic.  No areas of scalp tenderness or swelling  Eyes: EOM are normal. Pupils are equal, round, and reactive to light.  Neck: Neck supple.  No midline cervical tenderness or deformity  Cardiovascular: Regular rhythm and intact distal pulses.   Tachycardic rate 111  Pulmonary/Chest: Effort normal. No respiratory distress.  Abdominal: Soft. There is no tenderness.  Musculoskeletal: Normal range of motion. He exhibits no edema.  Superficial abrasions last back left elbow with full range of motion, no bony tenderness or deformity. Distal neurovascular intact. No areas of tenderness otherwise  Neurological: He is alert and oriented to person, place, and time. No cranial nerve deficit.  Skin: Skin is warm and dry.  Psychiatric:  Depressed suicidal     ED Course  Procedures (including critical care time) Labs Review Results for orders placed during the hospital encounter of 04/03/13  ACETAMINOPHEN LEVEL      Result Value Ref Range   Acetaminophen (Tylenol), Serum <15.0  10 - 30 ug/mL  CBC      Result Value Ref Range   WBC 6.7  4.0 - 10.5 K/uL   RBC 5.33  4.22 - 5.81 MIL/uL   Hemoglobin 16.0  13.0 - 17.0 g/dL   HCT 40.945.4  81.139.0 - 91.452.0 %   MCV 85.2  78.0 - 100.0 fL   MCH 30.0  26.0 -  34.0 pg   MCHC 35.2  30.0 - 36.0 g/dL   RDW 78.213.1  95.611.5 - 21.315.5 %   Platelets 155  150 - 400 K/uL  COMPREHENSIVE METABOLIC PANEL      Result Value Ref Range   Sodium 142  137 - 147 mEq/L   Potassium 3.7  3.7 - 5.3 mEq/L   Chloride 101  96 - 112 mEq/L   CO2 27  19 - 32 mEq/L   Glucose, Bld 99  70 - 99 mg/dL   BUN 21  6 - 23 mg/dL   Creatinine, Ser 0.861.39 (*) 0.50 - 1.35 mg/dL   Calcium 9.3  8.4 - 57.810.5 mg/dL   Total Protein 7.9  6.0 - 8.3 g/dL   Albumin 4.0  3.5 - 5.2 g/dL   AST 31  0 - 37 U/L   ALT 19  0 - 53 U/L   Alkaline Phosphatase 54  39 - 117 U/L   Total Bilirubin 0.2 (*) 0.3 - 1.2 mg/dL   GFR calc non Af Amer 64 (*) >90 mL/min   GFR calc Af Amer 74 (*) >90 mL/min  ETHANOL      Result Value Ref Range   Alcohol, Ethyl (B) <11  0 - 11 mg/dL  SALICYLATE LEVEL      Result Value Ref Range   Salicylate Lvl <2.0 (*) 2.8 - 20.0 mg/dL     4:694:16 AM TTS consulted, recs admit. Patient is voluntary and agreeable to admission  Plan Psych admit Home medications continued  MDM   Dx: Suicidal ideation, history of paranoid schizophrenia  Labs obtained and reviewed as above    Sunnie NielsenBrian Myrtis Maille, MD 04/03/13 [phone]

## 2013-04-03 NOTE — ED Provider Notes (Signed)
Psych team indicates pt accepted to [facility], Dr Jennet MaduroPucilowska.  Pt alert, content, nad, vitals normal. Appears stable for transfer.    Suzi RootsKevin E Shaia Porath, MD 04/03/13 [phone]

## 2013-04-03 NOTE — ED Notes (Signed)
Explained to the patient precautions taken since he has hallucinations.  Patient is currently in agreement with the plan of care.  He does not want his family contacted.  Discussed the need to void for a urine sample.

## 2013-04-03 NOTE — ED Notes (Signed)
Will hold patient in Pod A and inform Dr. Dierdre Highmanpitz.

## 2013-04-03 NOTE — BH Assessment (Signed)
Received a call for a tele-assessment. Spoke with Dr. Dierdre Highmanpitz who stated that pt is suicidal and jumped off a second story balcony today. Pt has a history of AH/VH. Pt continues to report that he is suicidal. Tele-assessment will be initiated.
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
## 2013-04-03 NOTE — ED Notes (Signed)
Called nursing line 3x with no answer. Called main hospital line and asked to be transferred to intake nurse. Miriam RN answered but said the number we had was correct and said she would let the nurse taking report know that we were trying to get a hold of her. Tried the number again after a few mins and still no answer

## 2013-05-09 ENCOUNTER — Encounter (HOSPITAL_COMMUNITY): Payer: Self-pay | Admitting: Emergency Medicine

## 2013-05-09 ENCOUNTER — Emergency Department (HOSPITAL_COMMUNITY)
Admission: EM | Admit: 2013-05-09 | Discharge: 2013-05-09 | Disposition: A | Discharge status: Home / Self Care | Payer: Medicaid Other | Attending: Emergency Medicine | Admitting: Emergency Medicine

## 2013-05-09 DIAGNOSIS — Z79899 Other long term (current) drug therapy: Secondary | ICD-10-CM | POA: Insufficient documentation

## 2013-05-09 DIAGNOSIS — K029 Dental caries, unspecified: Secondary | ICD-10-CM | POA: Insufficient documentation

## 2013-05-09 DIAGNOSIS — Z880 Allergy status to penicillin: Secondary | ICD-10-CM | POA: Insufficient documentation

## 2013-05-09 DIAGNOSIS — F200 Paranoid schizophrenia: Secondary | ICD-10-CM | POA: Insufficient documentation

## 2013-05-09 DIAGNOSIS — J45909 Unspecified asthma, uncomplicated: Secondary | ICD-10-CM | POA: Insufficient documentation

## 2013-05-09 MED ORDER — HYDROCODONE-ACETAMINOPHEN 5-325 MG PO TABS: 1.0000 | ORAL_TABLET | ORAL | Status: DC | PRN

## 2013-05-09 MED ORDER — HYDROCODONE-ACETAMINOPHEN 5-325 MG PO TABS
1.0000 | ORAL_TABLET | Freq: Once | ORAL | Status: AC
  Administered 2013-05-09: 1 via ORAL
  Filled 2013-05-09: qty 1

## 2013-05-09 MED ORDER — CLINDAMYCIN HCL 150 MG PO CAPS: 300.0000 mg | ORAL_CAPSULE | Freq: Three times a day (TID) | ORAL | Status: DC

## 2013-05-09 NOTE — ED Notes (Signed)
Onset several days right upper tooth pain.  Tooth is cracked.  Sensitive to cold.  Has not taken anything for pain.

## 2013-05-09 NOTE — Discharge Instructions (Signed)
Follow-up with a dentist as soon as possible  Take Vicodin for severe pain - Please be careful with this medication.  It can cause drowsiness.  Use caution while driving, operating machinery, drinking alcohol, or any other activities that may impair your physical or mental abilities.   Take antibiotic  Return to the emergency department if you develop any changing/worsening condition, fever, difficulty swallowing/breathing or any other concerns (please read additional information regarding your condition below)  Dental Pain A tooth ache may be caused by cavities (tooth decay). Cavities expose the nerve of the tooth to air and hot or cold temperatures. It may come from an infection or abscess (also called a boil or furuncle) around your tooth. It is also often caused by dental caries (tooth decay). This causes the pain you are having. DIAGNOSIS  Your caregiver can diagnose this problem by exam. TREATMENT   If caused by an infection, it may be treated with medications which kill germs (antibiotics) and pain medications as prescribed by your caregiver. Take medications as directed.  Only take over-the-counter or prescription medicines for pain, discomfort, or fever as directed by your caregiver.  Whether the tooth ache today is caused by infection or dental disease, you should see your dentist as soon as possible for further care. SEEK MEDICAL CARE IF: The exam and treatment you received today has been provided on an emergency basis only. This is not a substitute for complete medical or dental care. If your problem worsens or new problems (symptoms) appear, and you are unable to meet with your dentist, call or return to this location. SEEK IMMEDIATE MEDICAL CARE IF:   You have a fever.  You develop redness and swelling of your face, jaw, or neck.  You are unable to open your mouth.  You have severe pain uncontrolled by pain medicine. MAKE SURE YOU:   Understand these instructions.  Will  watch your condition.  Will get help right away if you are not doing well or get worse. Document Released: 12/31/2004 Document Revised: 03/25/2011 Document Reviewed: 08/19/2007 [facility] Patient Information 2014 [place], [place].   Emergency Department Resource Guide 1) Find a Doctor and Pay Out of Pocket Although you won't have to find out who is covered by your insurance plan, it is a good idea to ask around and get recommendations. You will then need to call the office and see if the doctor you have chosen will accept you as a new patient and what types of options they offer for patients who are self-pay. Some doctors offer discounts or will set up payment plans for their patients who do not have insurance, but you will need to ask so you aren't surprised when you get to your appointment.  2) Contact Your Local Health Department Not all health departments have doctors that can see patients for sick visits, but many do, so it is worth a call to see if yours does. If you don't know where your local health department is, you can check in your phone book. The CDC also has a tool to help you locate your state's health department, and many state websites also have listings of all of their local health departments.  3) Find a Walk-in Clinic If your illness is not likely to be very severe or complicated, you may want to try a walk in clinic. These are popping up all over the country in pharmacies, drugstores, and shopping centers. They're usually staffed by nurse practitioners or physician assistants that have been  trained to treat common illnesses and complaints. They're usually fairly quick and inexpensive. However, if you have serious medical issues or chronic medical problems, these are probably not your best option.  No Primary Care Doctor: - Call Health Connect at  [phone] - they can help you locate a primary care doctor that  accepts your insurance, provides certain services, etc. - Physician  Referral Service- [phone]  Chronic Pain Problems: Organization         Address  Phone   Notes  [facility]  [phone]) [phone] Patients need to be referred by their primary care doctor.   Medication Assistance: Organization         Address  Phone   Notes  [facility] Medication [facility] [address] [place], [place] [address] [phone] --Must be a resident of [facility] [place] -- Must have NO insurance coverage whatsoever (no Medicaid/ Medicare, etc.) -- The pt. MUST have a primary care doctor that directs their care regularly and follows them in the community   MedAssist  [phone]   [employer]  [phone]    Agencies that provide inexpensive medical care: Organization         Address  Phone   Notes  [facility]  [phone]) [phone]   [facility]    [phone]   [facility] [address] [place], [place] [address] [phone]   [facility] [address], [place] [phone]   [facility]anned [facility]    [phone]   [facility]    [phone]) [phone]   [facility]  [address], [place] Phone:  [phone], Fax:  [phone] Hours of Operation:  9 am - 6 pm, M-F.  Also accepts Medicaid/Medicare and self-pay.  [facility]  [address], [place] Phone: [phone](336) [phone], Fax: [phone]. Hours of Operation:  8:30 am - 5:30 pm, M-F.  Also accepts Medicaid and self-pay.  [facility] [place] [address], [place] Phone: [phone]) [phone]   [facility] [address], [place], [place] [phone], Ext. [phone] Mondays & Thursdays: 7-9 AM.  First 15 patients are seen on a first come, first serve basis.    Medicaid-accepting [facility] Providers:  Organization         Address  Phone   Notes  [facility] [address], [place] [phone] Also accepts self-pay patients.  [facility] [address], [place]  [phone]) [phone]   [facility] [address], [place] [phone]   [facility] [address] [place] [address], [place] [phone]) [phone]   Renaye RakersVeita Bland [address], [place]   [phone] Only accepts [place] Access [place] patients after they have their name applied to their card.   Self-Pay (no insurance) in [facility] [place]:  Organization         Address  Phone   Notes  Sickle Cell Patients, [facility] [address], [place] [phone]) [phone]   [facility] [address], [place] [phone]   [facility] [place]  [address], [place] [phone]   [facility]/Dr. Osei-Bonsu  [address] [place] [address], [place] or [place], [address], [place] [phone] Phone number for both [facility] and [place] locations is the same.  [facility] [address], [place] [phone]) [phone]   [facility] [place] [address] [place] [address], [place] or [address] [phone]) [phone]   [facility] [address], [place] [phone]) [phone], phone; [phone](336) [phone], fax Sees patients 1st and 3rd Saturday of every month.  Must not qualify for public or private insurance (i.e. Medicaid, Medicare, [place] Health Choice, Veterans' Benefits)  Household income should be no more than 200% of the poverty level The clinic cannot treat you if you are pregnant or think you are pregnant  Sexually transmitted diseases are not treated at the clinic.    Dental Care: Organization         Address  Phone  Notes  [facility] [address], [place] [phone] Accepts children up to age 37 who are enrolled  in [place]icaid or [place] Health Choice; pregnant women with a Medicaid card; and children who have applied for Medicaid or [place] Health Choice, but were declined, whose parents can pay a reduced fee at time of service.  [facility] [place]  [address], [place] [phone] Accepts children up to age 37 who are enrolled in [place] or [place] Health Choice; pregnant women with a Medicaid card; and children who have applied for Medicaid or [place] Health Choice, but were declined, whose parents can pay a reduced fee at time of service.  [facility]  [address], [place] [phone] Patients are seen by appointment only. Walk-ins are not accepted. [facility] will see patients 37 years of age and older. Monday - Tuesday (8am-5pm) Most Wednesdays (8:30-5pm) $30 per visit, cash only  [facility]  [address], [facility] [phone] Patients are seen by appointment only. Walk-ins are not accepted. [facility] will see patients 37 years of age and older. One Wednesday Evening (Monthly: Volunteer Based).  $30 per visit, cash only  [facility]  [phone] for adults; Children under age 244, call Graduate Pediatric Dentistry at [phone]. Children aged 294-14, please call [phone] to request a pediatric application.  Dental services are provided in all areas of dental care including fillings, crowns and bridges, complete and partial dentures, implants, gum treatment, root canals, and extractions. Preventive care is also provided. Treatment is provided to both adults and children. Patients are selected via a lottery and there is often a waiting list.   [facility] [address], [place]  [phone] [email or web address]   [facility] [address], [place], [place] [phone], Ext. [phone] Second and Fourth Thursday of each month, opens at  6:30 AM; Clinic ends at 9 AM.  Patients are seen on a first-come first-served basis, and a limited number are seen during each clinic.   [facility]  [address], [place], [place] [phone]) [phone]   Eligibility Requirements You must have lived in [place], [place], or [place] counties for at least the last three months.   You cannot be eligible for state or federal sponsored National Cityhealthcare insurance, including CIGNAVeterans Administration, [place]icaid, or [employer]Medicare.   You generally cannot be eligible for healthcare insurance through your employer.    How to apply: Eligibility screenings are  held every Tuesday and Wednesday afternoon from 1:00 pm until 4:00 pm. You do not need an appointment for the interview!  [facility] [address] [place], [place] [phone]   [facility] Health Department  [phone]   [facility] Health Department  [phone]   [facility] Health Department  [phone]    Behavioral Health Resources in the Community: Intensive Outpatient Programs Organization         Address  Phone  Notes  [facility] Services [address] [place], [place] [phone]   [facility] [address], [place], [place] [phone]   [facility] S[facility] [address], [place], [place]  [phone]   [facility] [address],  [place], [place] [phone] or [phone]   Substance Abuse Resources Organization         Address  Phone  Notes  Alcohol [facility] Drug Services  [phone]   [facility]  [phone]   [facility]  [phone]   Floydene Flock  [phone]   Residential & Outpatient Substance Abuse Program  [phone]   Psychological Services Organization         Address  Phone  Notes  [facility]al Health  [phone]   [facility]  [phone]   [facility] [address] [place]  [phone] or [phone]    Mobile Crisis Teams Organization         Address  Phone  Notes  [facility], Mobile Crisis Care Unit  [phone]   [facility]  [address] [place], [place] [phone]   Doristine Locks [address] [place] [phone]    Self-Help/Support Groups Organization         Address  Phone             Notes  [facility] [place] - variety of support groups  [phone]- I7437963 Call for more information  Narcotics Anonymous (NA), Caring [facility] [address], [employer] [place]  2 meetings at this location   Statistician         Address  Phone  Notes  [facility] [address],    [place]  [phone]   [facility]  [address] [place] [address], [place], [place] [phone]   [facility] [address] [place], [place] [phone] Admissions: 8am-3pm M-F  [facility] [address],    [place], [place] [phone]   [facility] [address] [place], [place], [place] [phone]   [facility] [address]  [place], [place] [phone]   [facility] - Intensive Outpatient [address], [place], [place] [phone]   [facility] [address] [place]  [place], [place] [phone] or [phone]   [facility] ([facility]) [address] [place] [address], [place], [place] [phone] Accepts Medicaid  [facility] [place] [address]  [place] [phone] Substance Abuse/Addiction Treatment   [facility] Organization         Address  Phone  Notes  [facility]  [phone]   Angie Fava, PhD [address] [place], [place]   [phone] or [phone]   [facility] Behavioral   [address] [place], [place] [phone]   [facility]  [address] [place], [place] [phone] Insurance/Medicaid/sponsorship  through [facility] Families [address], [address] [place], [place] [phone] [place], [place] [phone]    Dr. Adele Schilder  [phone]   [facility]. 1) [address] [place] 2) [place] 3)  [address], [place] [phone]  [phone]   [place] [phone] or [phone] (After Hours)

## 2013-05-09 NOTE — ED Notes (Signed)
Pt states hes had R upper toothache since last night

## 2013-05-09 NOTE — ED Provider Notes (Signed)
CSN: [id number]     Arrival date & time 05/09/13  1117 History  This chart was scribed for non-physician practitioner, Coral CeoJessica Jaleen Grupp, PA-C working with Geoffery Lyonsouglas Delo, MD by Greggory StallionKayla Andersen, ED scribe. This patient was seen in room TR10C/TR10C and the patient's care was started at 1:49 PM.   Chief Complaint  Patient presents with  . Dental Pain   The history is provided by the patient. No language interpreter was used.   HPI Comments: Darren [name] is a 38 y.o. male with a PMH of asthma and paranoid schizophrenia who presents to the Emergency Department complaining of gradual onset constant, throbbing right upper dental pain that started 2 days ago. He has not tried anything for his symptoms. Worse with eating and sensitive to cold. States tooth is cracked, however, denies any dental trauma. Pt was seen in February for a different tooth and states he followed up with a dentist and had the tooth removed. Denies fever, neck pain, difficulty swallowing/breathing, nausea or vomiting. States he "swells" when he takes PCN. Also has allergy to Ibuprofen.    Past Medical History  Diagnosis Date  . Asthma   . Paranoid schizophrenia    Past Surgical History  Procedure Laterality Date  . Hernia repair     History reviewed. No pertinent family history. History  Substance Use Topics  . Smoking status: Never Smoker   . Smokeless tobacco: Not on file  . Alcohol Use: No    Review of Systems  Constitutional: Negative for fever, chills, diaphoresis, activity change, appetite change and fatigue.  HENT: Positive for dental problem. Negative for congestion, ear pain, facial swelling, rhinorrhea, sore throat, trouble swallowing and voice change.   Gastrointestinal: Negative for nausea, vomiting and abdominal pain.  Musculoskeletal: Negative for myalgias and neck pain.  Skin: Negative for color change.  Neurological: Negative for headaches.  All other systems reviewed and are negative.   Allergies   Ibuprofen and Penicillins  Home Medications   Prior to Admission medications   Medication Sig Start Date End Date Taking? Authorizing Provider  albuterol (PROVENTIL HFA;VENTOLIN HFA) 108 (90 BASE) MCG/ACT inhaler Inhale into the lungs every 6 (six) hours as needed for wheezing or shortness of breath.    Historical Provider, MD  buPROPion (WELLBUTRIN) 100 MG tablet Take 1 tablet (100 mg total) by mouth daily. 02/17/13   Fransisca KaufmannLaura Davis, NP  multivitamin (PROSIGHT) TABS tablet Take 1 tablet by mouth daily. Please purchase an over the counter vitamin at your local pharmacy if you wish to continue this treatment. 02/17/13   Fransisca KaufmannLaura Davis, NP  OLANZapine zydis (ZYPREXA) 10 MG disintegrating tablet Take 1 tablet (10 mg total) by mouth at bedtime. 02/17/13   Fransisca KaufmannLaura Davis, NP  traZODone (DESYREL) 50 MG tablet Take 1 tablet (50 mg total) by mouth at bedtime as needed for sleep. 02/17/13   Fransisca KaufmannLaura Davis, NP   BP 123/74  Pulse 155  Temp(Src) 97.8 F (36.6 C) (Oral)  Resp 16  Ht 5\' 6"  (1.676 m)  Wt 155 lb 6 oz (70.478 kg)  BMI 25.09 kg/m2  SpO2 100%  Filed Vitals:   05/09/13 1129 05/09/13 1204 05/09/13 1357  BP: 123/74  105/86  Pulse: 155 95 100  Temp: 97.8 F (36.6 C)    TempSrc: Oral    Resp: 16    Height: 5\' 6"  (1.676 m)    Weight: 155 lb 6 oz (70.478 kg)    SpO2: 100% 100% 99%    Physical Exam  Nursing note  and vitals reviewed. Constitutional: He is oriented to person, place, and time. He appears well-developed and well-nourished. No distress.  HENT:  Head: Normocephalic and atraumatic.  Right Ear: External ear normal.  Left Ear: External ear normal.  Nose: Nose normal.  Mouth/Throat: Oropharynx is clear and moist.    Dental cary to the right upper 3rd molar with evidence of a dental cary. No abscess or gingival edema. No erythema to the posterior pharynx. Tonsils without edema or exudates. Uvula midline. No trismus. No difficulty controlling secretions. Tympanic membranes gray and translucent  bilaterally with no erythema, edema, or hemotympanum.  No mastoid or tragal tenderness bilaterally.   Eyes: EOM are normal.  Neck: Neck supple. No tracheal deviation present.  No cervical lymphadenopathy. No nuchal rigidity. No submental fullness.   Cardiovascular: Normal rate.   Pulmonary/Chest: Effort normal. No respiratory distress.  Musculoskeletal: Normal range of motion.  Neurological: He is alert and oriented to person, place, and time.  Skin: Skin is warm and dry. He is not diaphoretic.  Psychiatric: He has a normal mood and affect. His behavior is normal.    ED Course  Procedures (including critical care time)  DIAGNOSTIC STUDIES: Oxygen Saturation is 100% on RA, normal by my interpretation.    COORDINATION OF CARE: 1:51 PM-Discussed treatment plan which includes Vicodin and an antibiotic with pt at bedside and pt agreed to plan. Will give pt dental referrals and advised him to follow up.   Labs Review Labs Reviewed - No data to display  Imaging Review No results found.   EKG Interpretation None      MDM   Darren [name] is a 38 y.o. male with a PMH of asthma and paranoid schizophrenia who presents to the Emergency Department complaining of gradual onset constant, throbbing right upper dental pain that started 2 days ago. Etiology of dental pain possibly due to dental caries. No concerning signs/symptoms for Ludwig's Angina at this time. Patient afebrile and non-toxic in appearance. Patient instructed to follow-up with a dentist for further evaluation and management.  Resources provided.  Return precautions were given.  Patient in agreement with discharge and plan.     Discharge Medication List as of 05/09/2013  1:55 PM    START taking these medications   Details  clindamycin (CLEOCIN) 150 MG capsule Take 2 capsules (300 mg total) by mouth 3 (three) times daily. May dispense as 150mg  capsules, Starting 05/09/2013, Until Discontinued, Print     HYDROcodone-acetaminophen (NORCO/VICODIN) 5-325 MG per tablet Take 1 tablet by mouth every 4 (four) hours as needed., Starting 05/09/2013, Until Discontinued, Print         Final impressions: 1. Pain due to dental caries       Luiz IronJessica Katlin Adamaris King PA-C   I personally performed the services described in this documentation, which was scribed in my presence. The recorded information has been reviewed and is accurate.  Jillyn LedgerJessica K Kaylaann Mountz, PA-C 05/09/13 2229

## 2013-05-11 NOTE — ED Provider Notes (Signed)
Medical screening examination/treatment/procedure(s) were performed by non-physician practitioner and as supervising physician I was immediately available for consultation/collaboration.     Rayola Everhart, MD 05/11/13 1410 

## 2013-05-30 ENCOUNTER — Emergency Department (HOSPITAL_COMMUNITY)
Admission: EM | Admit: 2013-05-30 | Discharge: 2013-05-31 | Discharge status: Against Medical Advice | Payer: Medicaid Other | Attending: Emergency Medicine | Admitting: Emergency Medicine

## 2013-05-30 ENCOUNTER — Emergency Department (HOSPITAL_COMMUNITY): Payer: Medicaid Other

## 2013-05-30 ENCOUNTER — Encounter (HOSPITAL_COMMUNITY): Payer: Self-pay | Admitting: Emergency Medicine

## 2013-05-30 DIAGNOSIS — R0789 Other chest pain: Secondary | ICD-10-CM | POA: Insufficient documentation

## 2013-05-30 DIAGNOSIS — R42 Dizziness and giddiness: Secondary | ICD-10-CM | POA: Insufficient documentation

## 2013-05-30 DIAGNOSIS — J45909 Unspecified asthma, uncomplicated: Secondary | ICD-10-CM | POA: Insufficient documentation

## 2013-05-30 DIAGNOSIS — R110 Nausea: Secondary | ICD-10-CM | POA: Insufficient documentation

## 2013-05-30 DIAGNOSIS — K089 Disorder of teeth and supporting structures, unspecified: Secondary | ICD-10-CM | POA: Insufficient documentation

## 2013-05-30 LAB — BASIC METABOLIC PANEL
BUN: 15 mg/dL (ref 6–23)
CALCIUM: 9.2 mg/dL (ref 8.4–10.5)
CO2: 28 mEq/L (ref 19–32)
CREATININE: 1.15 mg/dL (ref 0.50–1.35)
Chloride: 102 mEq/L (ref 96–112)
GFR calc Af Amer: >90 mL/min (ref >90)
GFR, EST NON AFRICAN AMERICAN: 80 mL/min — AB (ref >90)
GLUCOSE: 87 mg/dL (ref 70–99)
Potassium: 3.9 mEq/L (ref 3.7–5.3)
Sodium: 141 mEq/L (ref 137–147)

## 2013-05-30 LAB — CBC
HCT: 45.6 % (ref 39.0–52.0)
HEMOGLOBIN: 15.8 g/dL (ref 13.0–17.0)
MCH: 29.2 pg (ref 26.0–34.0)
MCHC: 34.6 g/dL (ref 30.0–36.0)
MCV: 84.1 fL (ref 78.0–100.0)
Platelets: 155 10*3/uL (ref 150–400)
RBC: 5.42 MIL/uL (ref 4.22–5.81)
RDW: 13.6 % (ref 11.5–15.5)
WBC: 5.7 10*3/uL (ref 4.0–10.5)

## 2013-05-30 LAB — PRO B NATRIURETIC PEPTIDE: Pro B Natriuretic peptide (BNP): 5.1 pg/mL (ref 0–125)

## 2013-05-30 LAB — I-STAT TROPONIN, ED: TROPONIN I, POC: 0 ng/mL (ref 0.00–0.08)

## 2013-05-30 NOTE — ED Notes (Addendum)
Pt reports intermittent central chest "pressure" x 2 days "that only comes after I eat." Also reports dizziness, lightheadedness, SOB and nausea associated with chest pain. Pt also reports dental pain to left upper mouth x 2 days. Denies taking anything for pain. NAD.

## 2013-05-31 NOTE — ED Notes (Signed)
No answer. Unable to find. Not in w/r,h/w, triage, b/r or entry way.

## 2013-06-03 ENCOUNTER — Emergency Department (HOSPITAL_COMMUNITY): Payer: Medicaid Other

## 2013-06-03 ENCOUNTER — Encounter (HOSPITAL_COMMUNITY): Payer: Self-pay | Admitting: Emergency Medicine

## 2013-06-03 ENCOUNTER — Emergency Department (HOSPITAL_COMMUNITY)
Admission: EM | Admit: 2013-06-03 | Discharge: 2013-06-05 | Disposition: A | Discharge status: Other Acute Inpatient Hospital | Payer: Medicaid Other | Attending: Emergency Medicine | Admitting: Emergency Medicine

## 2013-06-03 DIAGNOSIS — S161XXA Strain of muscle, fascia and tendon at neck level, initial encounter: Secondary | ICD-10-CM

## 2013-06-03 DIAGNOSIS — R45850 Homicidal ideations: Secondary | ICD-10-CM | POA: Insufficient documentation

## 2013-06-03 DIAGNOSIS — J45909 Unspecified asthma, uncomplicated: Secondary | ICD-10-CM | POA: Insufficient documentation

## 2013-06-03 DIAGNOSIS — S139XXA Sprain of joints and ligaments of unspecified parts of neck, initial encounter: Secondary | ICD-10-CM | POA: Insufficient documentation

## 2013-06-03 DIAGNOSIS — F209 Schizophrenia, unspecified: Secondary | ICD-10-CM

## 2013-06-03 DIAGNOSIS — R45851 Suicidal ideations: Secondary | ICD-10-CM | POA: Insufficient documentation

## 2013-06-03 DIAGNOSIS — S43409A Unspecified sprain of unspecified shoulder joint, initial encounter: Secondary | ICD-10-CM

## 2013-06-03 DIAGNOSIS — F329 Major depressive disorder, single episode, unspecified: Secondary | ICD-10-CM | POA: Insufficient documentation

## 2013-06-03 DIAGNOSIS — Z880 Allergy status to penicillin: Secondary | ICD-10-CM | POA: Insufficient documentation

## 2013-06-03 DIAGNOSIS — IMO0002 Reserved for concepts with insufficient information to code with codable children: Secondary | ICD-10-CM | POA: Insufficient documentation

## 2013-06-03 DIAGNOSIS — F3289 Other specified depressive episodes: Secondary | ICD-10-CM | POA: Insufficient documentation

## 2013-06-03 DIAGNOSIS — F200 Paranoid schizophrenia: Secondary | ICD-10-CM | POA: Insufficient documentation

## 2013-06-03 DIAGNOSIS — Z79899 Other long term (current) drug therapy: Secondary | ICD-10-CM | POA: Insufficient documentation

## 2013-06-03 LAB — COMPREHENSIVE METABOLIC PANEL
ALT: 33 U/L (ref 0–53)
AST: 34 U/L (ref 0–37)
Albumin: 3.9 g/dL (ref 3.5–5.2)
Alkaline Phosphatase: 63 U/L (ref 39–117)
BUN: 17 mg/dL (ref 6–23)
CALCIUM: 9.5 mg/dL (ref 8.4–10.5)
CO2: 23 mEq/L (ref 19–32)
CREATININE: 1.04 mg/dL (ref 0.50–1.35)
Chloride: 105 mEq/L (ref 96–112)
GLUCOSE: 118 mg/dL — AB (ref 70–99)
Potassium: 3.9 mEq/L (ref 3.7–5.3)
Sodium: 141 mEq/L (ref 137–147)
Total Bilirubin: <0.2 mg/dL — ABNORMAL LOW (ref 0.3–1.2)
Total Protein: 8 g/dL (ref 6.0–8.3)

## 2013-06-03 LAB — ACETAMINOPHEN LEVEL: Acetaminophen (Tylenol), Serum: <15 ug/mL (ref 10–30)

## 2013-06-03 LAB — RAPID URINE DRUG SCREEN, HOSP PERFORMED
Amphetamines: NOT DETECTED
Barbiturates: NOT DETECTED
Benzodiazepines: NOT DETECTED
Cocaine: NOT DETECTED
OPIATES: NOT DETECTED
TETRAHYDROCANNABINOL: NOT DETECTED

## 2013-06-03 LAB — CBC
HEMATOCRIT: 44.4 % (ref 39.0–52.0)
HEMOGLOBIN: 15.3 g/dL (ref 13.0–17.0)
MCH: 29 pg (ref 26.0–34.0)
MCHC: 34.5 g/dL (ref 30.0–36.0)
MCV: 84.1 fL (ref 78.0–100.0)
Platelets: 133 10*3/uL — ABNORMAL LOW (ref 150–400)
RBC: 5.28 MIL/uL (ref 4.22–5.81)
RDW: 13.6 % (ref 11.5–15.5)
WBC: 8.3 10*3/uL (ref 4.0–10.5)

## 2013-06-03 LAB — ETHANOL: Alcohol, Ethyl (B): <11 mg/dL (ref 0–11)

## 2013-06-03 LAB — SALICYLATE LEVEL

## 2013-06-03 MED ORDER — TRAZODONE HCL 50 MG PO TABS
100.0000 mg | ORAL_TABLET | Freq: Every day | ORAL | Status: DC
  Administered 2013-06-03 – 2013-06-04 (×2): 100 mg via ORAL
  Filled 2013-06-03 (×2): qty 2

## 2013-06-03 MED ORDER — ACETAMINOPHEN 325 MG PO TABS
650.0000 mg | ORAL_TABLET | ORAL | Status: DC | PRN
  Administered 2013-06-04: 650 mg via ORAL
  Filled 2013-06-03: qty 2

## 2013-06-03 MED ORDER — OLANZAPINE 10 MG PO TBDP
20.0000 mg | ORAL_TABLET | Freq: Every day | ORAL | Status: DC
  Administered 2013-06-04 (×2): 20 mg via ORAL
  Filled 2013-06-03 (×3): qty 2

## 2013-06-03 MED ORDER — BUPROPION HCL 100 MG PO TABS
100.0000 mg | ORAL_TABLET | Freq: Two times a day (BID) | ORAL | Status: DC
  Administered 2013-06-04 – 2013-06-05 (×4): 100 mg via ORAL
  Filled 2013-06-03 (×5): qty 1

## 2013-06-03 MED ORDER — ACETAMINOPHEN 325 MG PO TABS
650.0000 mg | ORAL_TABLET | Freq: Once | ORAL | Status: AC
  Administered 2013-06-03: 650 mg via ORAL
  Filled 2013-06-03: qty 2

## 2013-06-03 MED ORDER — LORAZEPAM 1 MG PO TABS: 1.0000 mg | ORAL_TABLET | Freq: Three times a day (TID) | ORAL | Status: DC | PRN

## 2013-06-03 MED ORDER — ONDANSETRON HCL 4 MG PO TABS: 4.0000 mg | ORAL_TABLET | Freq: Three times a day (TID) | ORAL | Status: DC | PRN
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
## 2013-06-03 NOTE — ED Notes (Signed)
Pt reports paranoid schizophrenia, hasn;t been taking meds in 2 weeks. Is hearing voices, telling him to kill himself and seeing little people. Denies SI or HI. Pt is calm and cooperative.

## 2013-06-03 NOTE — ED Notes (Signed)
Patient transported to X-ray 
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
## 2013-06-03 NOTE — ED Notes (Signed)
AC, Ron made aware of need for sitter.

## 2013-06-03 NOTE — ED Notes (Signed)
Unable to give urine specimen at this time , [place] sandwich / H2O and juice given to pt.

## 2013-06-04 ENCOUNTER — Encounter (HOSPITAL_COMMUNITY): Payer: Self-pay | Admitting: Emergency Medicine

## 2013-06-04 NOTE — BH Assessment (Signed)
Assessment complete. Clint Bolder, [facility], confirmed [address] is at capacity. Gave clinical report to Alberteen Sam, NP who said Pt meets criteria for inpatient psychiatric treatment. TTS will contact other facilities for placement. Notified Dr. Blane Ohara and Renae Fickle

## 2013-06-04 NOTE — ED Provider Notes (Signed)
Patient not seen by me and I was not directly involved in the patient care. Behavioral Health/Psychiatry has evaluated the patient and, as per their note and recommendation, patient was to be discharged. Follow-up recommendations and medications determined by/prescribed by Behavioral Health/Psychiatry.   Gilda Crease, MD 06/04/13 [phone]

## 2013-06-04 NOTE — BH Assessment (Addendum)
[facility] Assessment Progress Note The following facilities were contacted by this clinician in an attempt to place pt:   [facility] - no beds per Barbara @ 1039  [facility] - beds per Anita @ 1040 - Referral faxed for review  [facility] - beds per Sherry @ 1054 - Referral faxed for review  [facility] - only have gero beds per Darlene @ 1056  [facility] - no answer @ 1056  [facility] - no beds per Travis @ 1059  [facility] - no beds per Cindy @ 1100  [facility] - no beds per Amy @ 1100  [facility] - no beds per Dana @ 1100  [facility] - discharges expected today per tony @ 1103 - Referral faxed for review  [facility] - accepting referrals per Valerie @ 1103 - Referral faxed for review  [facility] - possible beds per Sonya @ 1104 - Referral faxed for review   Kristen Amiyah Shryock, MS, LPC  Licensed Professional Counselor  Triage Specialist       

## 2013-06-04 NOTE — ED Notes (Signed)
Patient is resting.  Watching TV.  Sitter remains at bedside.  He denies any complaints at this time
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
## 2013-06-04 NOTE — BH Assessment (Signed)
Spoke to Blane Ohara, MD who said to start tele-assessment.  Harlin Rain Ria Comment, [facility] Triage Specialist [phone]
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
## 2013-06-04 NOTE — BH Assessment (Signed)
Assessment complete. Clint Bolder, [facility], confirmed [address] is at capacity. Gave clinical report to Alberteen Sam, NP who said Pt meets criteria for inpatient psychiatric treatment. TTS will contact other facilities for placement. Notified Dr. Blane Ohara and Lavone Nian, RN of recommendation.  Harlin Rain Ria Comment, [facility] Triage Specialist [phone]

## 2013-06-04 NOTE — ED Provider Notes (Addendum)
Patient resting comfortably this morning, no overnight problems. Has been off medications and is psychotic, history of schizophrenia. Awaiting further evaluation and placement.  Filed Vitals:   06/04/13 0651  BP: 110/60  Pulse: 87  Temp: 98.4 F (36.9 C)  Resp: 18     Gilda Crease, MD 06/04/13 4742  Gilda Crease, MD 06/04/13 [phone]
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
## 2013-06-04 NOTE — ED Notes (Signed)
Patient were served his ivc papers

## 2013-06-05 NOTE — ED Provider Notes (Signed)
  Physical Exam  BP 93/58  Pulse 83  Temp(Src) 97.5 F (36.4 C) (Oral)  Resp 18  SpO2 98%  Physical Exam  ED Course  Procedures  No issues as per nursing. Will transfer to [facility] later on for inpatient psych. Stable for transfer.      Richardean Canal, MD 06/05/13 [phone]

## 2013-06-06 NOTE — ED Provider Notes (Signed)
I have personally seen and examined the patient.  I have discussed the plan of care with the resident.  I have reviewed the documentation on PMH/FH/Soc. History.  I have reviewed the documentation of the resident and agree.   Joya Gaskins, MD 06/06/13 [phone]

## 2013-08-10 ENCOUNTER — Emergency Department (HOSPITAL_COMMUNITY)
Admission: EM | Admit: 2013-08-10 | Discharge: 2013-08-11 | Disposition: A | Discharge status: Other Acute Inpatient Hospital | Payer: Medicaid Other | Attending: Emergency Medicine | Admitting: Emergency Medicine

## 2013-08-10 ENCOUNTER — Encounter (HOSPITAL_COMMUNITY): Payer: Self-pay | Admitting: Emergency Medicine

## 2013-08-10 DIAGNOSIS — Z87891 Personal history of nicotine dependence: Secondary | ICD-10-CM | POA: Insufficient documentation

## 2013-08-10 DIAGNOSIS — Z79899 Other long term (current) drug therapy: Secondary | ICD-10-CM | POA: Insufficient documentation

## 2013-08-10 DIAGNOSIS — R45851 Suicidal ideations: Secondary | ICD-10-CM

## 2013-08-10 DIAGNOSIS — X789XXA Intentional self-harm by unspecified sharp object, initial encounter: Secondary | ICD-10-CM | POA: Diagnosis not present

## 2013-08-10 DIAGNOSIS — F411 Generalized anxiety disorder: Secondary | ICD-10-CM | POA: Diagnosis not present

## 2013-08-10 DIAGNOSIS — H5316 Psychophysical visual disturbances: Secondary | ICD-10-CM | POA: Insufficient documentation

## 2013-08-10 DIAGNOSIS — R45850 Homicidal ideations: Secondary | ICD-10-CM | POA: Insufficient documentation

## 2013-08-10 DIAGNOSIS — F319 Bipolar disorder, unspecified: Secondary | ICD-10-CM | POA: Diagnosis not present

## 2013-08-10 DIAGNOSIS — IMO0002 Reserved for concepts with insufficient information to code with codable children: Secondary | ICD-10-CM | POA: Insufficient documentation

## 2013-08-10 DIAGNOSIS — Z008 Encounter for other general examination: Secondary | ICD-10-CM | POA: Insufficient documentation

## 2013-08-10 DIAGNOSIS — R443 Hallucinations, unspecified: Secondary | ICD-10-CM

## 2013-08-10 DIAGNOSIS — J45909 Unspecified asthma, uncomplicated: Secondary | ICD-10-CM | POA: Diagnosis not present

## 2013-08-10 DIAGNOSIS — F209 Schizophrenia, unspecified: Secondary | ICD-10-CM | POA: Insufficient documentation

## 2013-08-10 DIAGNOSIS — Z880 Allergy status to penicillin: Secondary | ICD-10-CM | POA: Insufficient documentation

## 2013-08-10 HISTORY — DX: Schizophrenia, unspecified: F20.9

## 2013-08-10 HISTORY — DX: Bipolar disorder, unspecified: F31.9

## 2013-08-10 LAB — RAPID URINE DRUG SCREEN, HOSP PERFORMED
Amphetamines: NOT DETECTED
BARBITURATES: NOT DETECTED
BENZODIAZEPINES: NOT DETECTED
Cocaine: NOT DETECTED
Opiates: NOT DETECTED
Tetrahydrocannabinol: NOT DETECTED

## 2013-08-10 LAB — CBC
HEMATOCRIT: 47 % (ref 39.0–52.0)
Hemoglobin: 15.9 g/dL (ref 13.0–17.0)
MCH: 29 pg (ref 26.0–34.0)
MCHC: 33.8 g/dL (ref 30.0–36.0)
MCV: 85.6 fL (ref 78.0–100.0)
Platelets: 147 10*3/uL — ABNORMAL LOW (ref 150–400)
RBC: 5.49 MIL/uL (ref 4.22–5.81)
RDW: 13.7 % (ref 11.5–15.5)
WBC: 5.4 10*3/uL (ref 4.0–10.5)

## 2013-08-10 LAB — SALICYLATE LEVEL

## 2013-08-10 LAB — COMPREHENSIVE METABOLIC PANEL
ALT: 13 U/L (ref 0–53)
ANION GAP: 14 (ref 5–15)
AST: 19 U/L (ref 0–37)
Albumin: 4.3 g/dL (ref 3.5–5.2)
Alkaline Phosphatase: 57 U/L (ref 39–117)
BILIRUBIN TOTAL: 0.3 mg/dL (ref 0.3–1.2)
BUN: 19 mg/dL (ref 6–23)
CALCIUM: 9.8 mg/dL (ref 8.4–10.5)
CHLORIDE: 106 meq/L (ref 96–112)
CO2: 24 meq/L (ref 19–32)
Creatinine, Ser: 1.16 mg/dL (ref 0.50–1.35)
GFR, EST NON AFRICAN AMERICAN: 80 mL/min — AB (ref >90)
GLUCOSE: 106 mg/dL — AB (ref 70–99)
Potassium: 4 mEq/L (ref 3.7–5.3)
Sodium: 144 mEq/L (ref 137–147)
Total Protein: 8.4 g/dL — ABNORMAL HIGH (ref 6.0–8.3)

## 2013-08-10 LAB — ACETAMINOPHEN LEVEL: Acetaminophen (Tylenol), Serum: <15 ug/mL (ref 10–30)

## 2013-08-10 LAB — ETHANOL

## 2013-08-10 MED ORDER — NICOTINE 21 MG/24HR TD PT24: 21.0000 mg | MEDICATED_PATCH | Freq: Every day | TRANSDERMAL | Status: DC

## 2013-08-10 MED ORDER — ALUM & MAG HYDROXIDE-SIMETH 200-200-20 MG/5ML PO SUSP: 30.0000 mL | ORAL | Status: DC | PRN

## 2013-08-10 MED ORDER — IBUPROFEN 200 MG PO TABS: 600.0000 mg | ORAL_TABLET | Freq: Three times a day (TID) | ORAL | Status: DC | PRN

## 2013-08-10 MED ORDER — ZOLPIDEM TARTRATE 5 MG PO TABS: 5.0000 mg | ORAL_TABLET | Freq: Every evening | ORAL | Status: DC | PRN

## 2013-08-10 MED ORDER — ONDANSETRON HCL 4 MG PO TABS: 4.0000 mg | ORAL_TABLET | Freq: Three times a day (TID) | ORAL | Status: DC | PRN

## 2013-08-10 MED ORDER — ALBUTEROL SULFATE HFA 108 (90 BASE) MCG/ACT IN AERS
2.0000 | INHALATION_SPRAY | Freq: Four times a day (QID) | RESPIRATORY_TRACT | Status: DC | PRN
  Administered 2013-08-11: 2 via RESPIRATORY_TRACT
  Filled 2013-08-10: qty 6.7

## 2013-08-10 MED ORDER — ACETAMINOPHEN 325 MG PO TABS: 650.0000 mg | ORAL_TABLET | ORAL | Status: DC | PRN

## 2013-08-10 NOTE — ED Provider Notes (Signed)
CSN: [id number]     Arrival date & time 08/10/13  1247 History  This chart was scribed for non-physician practitioner, Marlon Peliffany Yohanna Tow, PA-C working with Suzi RootsKevin E Steinl, MD by Greggory StallionKayla Andersen, ED scribe. This patient was seen in room WTR3/WLPT3 and the patient's care was started at 1:32 PM.    Chief Complaint  Patient presents with  . Suicidal   The history is provided by the patient. No language interpreter was used.   HPI Comments: Darren [name] is a 38 y.o. male with history of schizophrenia and bipolar disorder who presents to the Emergency Department for medical clearance. Reports auditory and visual hallucinations, anxiety, depression and suicidal ideations. States the voices are telling him to hurt himself and other people. Pt states he has been having hallucinations for several years but takes has daily medications for it. States he ran out of the prescriptions 3-4 months ago because he could not afford to fill them. He has a plan of cutting his wrists and states he attempted earlier today. Denies recent drug or alcohol use.   Past Medical History  Diagnosis Date  . Schizophrenia   . Bipolar disorder (manic depression)   . Asthma    Past Surgical History  Procedure Laterality Date  . Hernia repair     No family history on file. History  Substance Use Topics  . Smoking status: Former Games developermoker  . Smokeless tobacco: Not on file  . Alcohol Use: No    Review of Systems  Psychiatric/Behavioral: Positive for suicidal ideas, hallucinations and self-injury. The patient is nervous/anxious.   All other systems reviewed and are negative.  Allergies  Shrimp and Penicillins  Home Medications   Prior to Admission medications   Medication Sig Start Date End Date Taking? Authorizing Provider  albuterol (PROVENTIL HFA;VENTOLIN HFA) 108 (90 BASE) MCG/ACT inhaler Inhale 2 puffs into the lungs every 6 (six) hours as needed for wheezing or shortness of breath.    Historical Provider, MD   buPROPion (WELLBUTRIN SR) 150 MG 12 hr tablet Take 150 mg by mouth 2 (two) times daily.    Historical Provider, MD  OLANZapine (ZYPREXA) 20 MG tablet Take 20 mg by mouth at bedtime.    Historical Provider, MD  traZODone (DESYREL) 50 MG tablet Take 50 mg by mouth at bedtime.    Historical Provider, MD   BP 107/74  Pulse 121  Temp(Src) 98.4 F (36.9 C) (Oral)  Resp 20  SpO2 98%  Physical Exam  Nursing note and vitals reviewed. Constitutional: He is oriented to person, place, and time. He appears well-developed and well-nourished. No distress.  HENT:  Head: Normocephalic and atraumatic.  Eyes: Conjunctivae and EOM are normal.  Neck: Neck supple. No tracheal deviation present.  Cardiovascular: Normal rate.   Pulmonary/Chest: Effort normal. No respiratory distress.  Musculoskeletal: Normal range of motion.  Superficial abrasion to right wrist, pt states is painful. 2 cm in length.   Neurological: He is alert and oriented to person, place, and time.  Skin: Skin is warm and dry.  Psychiatric: He has a normal mood and affect. His behavior is normal. He expresses homicidal and suicidal ideation. He expresses suicidal plans.    ED Course  Procedures (including critical care time)  DIAGNOSTIC STUDIES: Oxygen Saturation is 98% on RA, normal by my interpretation.    COORDINATION OF CARE: 1:34 PM-Discussed treatment plan which includes lab work and speaking with behavioral health with pt at bedside and pt agreed to plan.   Labs Review Labs  Reviewed  ACETAMINOPHEN LEVEL  CBC  COMPREHENSIVE METABOLIC PANEL  ETHANOL  SALICYLATE LEVEL  URINE RAPID DRUG SCREEN (HOSP PERFORMED)    Imaging Review No results found.   EKG Interpretation None      MDM   Final diagnoses:  Suicidal ideation  Homicidal ideation  Schizophrenia, unspecified type  Hallucination    Home meds reviewed, he has been off of his psychiatric medications for 3-4 months, therefore these have not been  restarted. Albuterol ordered.   Psych holding orders placed and labs pending.  I personally performed the services described in this documentation, which was scribed in my presence. The recorded information has been reviewed and is accurate.  Dorthula Matas, PA-C 08/10/13 1344

## 2013-08-10 NOTE — ED Notes (Signed)
Pt here for medical clearance c/o SI. Pt had plan of cutting wrist and has been having auditory and visual hallucinations. States voices are telling him to hurt himself.

## 2013-08-10 NOTE — ED Notes (Signed)
Pt. Has one belonging bag with a pair of gray shorts, white t-shirt, and black net for his head.

## 2013-08-10 NOTE — Consult Note (Signed)
  Review of Systems  Constitutional: Negative.   HENT: Negative.   Eyes: Negative.   Respiratory: Negative.   Cardiovascular: Negative.   Gastrointestinal: Negative.   Genitourinary: Negative.   Musculoskeletal: Negative.   Skin: Negative.   Neurological: Negative.   Endo/Heme/Allergies: Negative.   Psychiatric/Behavioral: Positive for hallucinations.    

## 2013-08-10 NOTE — Consult Note (Signed)
[facility] Psychiatry Consult   Reason for Consult:  Hallucinations telling him to kill himself Referring Physician:  ER MD  Darren [name] is an 38 y.o. male. Total Time spent with patient: 45 minutes  Assessment: AXIS I:  Psychotic Disorder NOS AXIS II:  Deferred AXIS III:   Past Medical History  Diagnosis Date  . Schizophrenia   . Bipolar disorder (manic depression)   . Asthma    AXIS IV:  chronic mental illness AXIS V:  51-60 moderate symptoms  Plan:  Recommend psychiatric Inpatient admission when medically cleared.  Subjective:   Darren [name] is a 38 y.o. male patient admitted with command hallucinations.  HPI:  Mr Hubers was accompanied by his friend.  He says he hears voices telling him to kill himself and has heard these voices for years.  They  Have gotten worse recently even though he takes 20 mg of Zyprexa daily.  His friend says she gives it to him so she knows he takes it.  She says he decompensates every month about this time.  His friend says he is bored because he does not know anybody in [place].  They moved here from [place] to start a new ministry.   HPI Elements:   Location:  hearing voices. Quality:  telling him to kill himself. Severity:  as above. Timing:  no precipitants. Duration:  years. Context:  about this tme every month this happens.  Past Psychiatric History: Past Medical History  Diagnosis Date  . Schizophrenia   . Bipolar disorder (manic depression)   . Asthma     reports that he has quit smoking. He does not have any smokeless tobacco history on file. He reports that he does not drink alcohol. His drug history is not on file. No family history on file.         Allergies:   Allergies  Allergen Reactions  . Shrimp [Shellfish Allergy] Anaphylaxis  . Penicillins Hives and Itching    ACT Assessment Complete:  Yes:    Educational Status    Risk to Self: Risk to self with the past 6 months Is patient at risk for suicide?:  Yes Substance abuse history and/or treatment for substance abuse?: Yes  Risk to Others:    Abuse:    Prior Inpatient Therapy:    Prior Outpatient Therapy:    Additional Information:                    Objective: Blood pressure 107/74, pulse 121, temperature 98.4 F (36.9 C), temperature source Oral, resp. rate 20, SpO2 98.00%.There is no height or weight on file to calculate BMI. Results for orders placed during the hospital encounter of 08/10/13 (from the past 72 hour(s))  ACETAMINOPHEN LEVEL     Status: None   Collection Time    08/10/13  1:36 PM      Result Value Ref Range   Acetaminophen (Tylenol), Serum <15.0  10 - 30 ug/mL   Comment:            THERAPEUTIC CONCENTRATIONS VARY     SIGNIFICANTLY. A RANGE OF 10-30     ug/mL MAY BE AN EFFECTIVE     CONCENTRATION FOR MANY PATIENTS.     HOWEVER, SOME ARE BEST TREATED     AT CONCENTRATIONS OUTSIDE THIS     RANGE.     ACETAMINOPHEN CONCENTRATIONS     >150 ug/mL AT 4 HOURS AFTER     INGESTION AND >50 ug/mL AT 12  HOURS AFTER INGESTION ARE     OFTEN ASSOCIATED WITH TOXIC     REACTIONS.  CBC     Status: Abnormal   Collection Time    08/10/13  1:36 PM      Result Value Ref Range   WBC 5.4  4.0 - 10.5 K/uL   RBC 5.49  4.22 - 5.81 MIL/uL   Hemoglobin 15.9  13.0 - 17.0 g/dL   HCT 47.0  39.0 - 52.0 %   MCV 85.6  78.0 - 100.0 fL   MCH 29.0  26.0 - 34.0 pg   MCHC 33.8  30.0 - 36.0 g/dL   RDW 13.7  11.5 - 15.5 %   Platelets 147 (*) 150 - 400 K/uL  COMPREHENSIVE METABOLIC PANEL     Status: Abnormal   Collection Time    08/10/13  1:36 PM      Result Value Ref Range   Sodium 144  137 - 147 mEq/L   Potassium 4.0  3.7 - 5.3 mEq/L   Chloride 106  96 - 112 mEq/L   CO2 24  19 - 32 mEq/L   Glucose, Bld 106 (*) 70 - 99 mg/dL   BUN 19  6 - 23 mg/dL   Creatinine, Ser 1.16  0.50 - 1.35 mg/dL   Calcium 9.8  8.4 - 10.5 mg/dL   Total Protein 8.4 (*) 6.0 - 8.3 g/dL   Albumin 4.3  3.5 - 5.2 g/dL   AST 19  0 - 37 U/L    ALT 13  0 - 53 U/L   Alkaline Phosphatase 57  39 - 117 U/L   Total Bilirubin 0.3  0.3 - 1.2 mg/dL   GFR calc non Af Amer 80 (*) >90 mL/min   GFR calc Af Amer >90  >90 mL/min   Comment: (NOTE)     The eGFR has been calculated using the CKD EPI equation.     This calculation has not been validated in all clinical situations.     eGFR's persistently <90 mL/min signify possible Chronic Kidney     Disease.   Anion gap 14  5 - 15  ETHANOL     Status: None   Collection Time    08/10/13  1:36 PM      Result Value Ref Range   Alcohol, Ethyl (B) <11  0 - 11 mg/dL   Comment:            LOWEST DETECTABLE LIMIT FOR     SERUM ALCOHOL IS 11 mg/dL     FOR MEDICAL PURPOSES ONLY  SALICYLATE LEVEL     Status: Abnormal   Collection Time    08/10/13  1:36 PM      Result Value Ref Range   Salicylate Lvl <3.1 (*) 2.8 - 20.0 mg/dL  URINE RAPID DRUG SCREEN (HOSP PERFORMED)     Status: None   Collection Time    08/10/13  1:46 PM      Result Value Ref Range   Opiates NONE DETECTED  NONE DETECTED   Cocaine NONE DETECTED  NONE DETECTED   Benzodiazepines NONE DETECTED  NONE DETECTED   Amphetamines NONE DETECTED  NONE DETECTED   Tetrahydrocannabinol NONE DETECTED  NONE DETECTED   Barbiturates NONE DETECTED  NONE DETECTED   Comment:            DRUG SCREEN FOR MEDICAL PURPOSES     ONLY.  IF CONFIRMATION IS NEEDED     FOR ANY PURPOSE, NOTIFY LAB  WITHIN 5 DAYS.                LOWEST DETECTABLE LIMITS     FOR URINE DRUG SCREEN     Drug Class       Cutoff (ng/mL)     Amphetamine      1000     Barbiturate      200     Benzodiazepine   161     Tricyclics       096     Opiates          300     Cocaine          300     THC              50   Labs are reviewed and are pertinent for no psychiatric issue.  Current Facility-Administered Medications  Medication Dose Route Frequency Provider Last Rate Last Dose  . acetaminophen (TYLENOL) tablet 650 mg  650 mg Oral Q4H PRN Linus Mako, PA-C      .  albuterol (PROVENTIL HFA;VENTOLIN HFA) 108 (90 BASE) MCG/ACT inhaler 2 puff  2 puff Inhalation Q6H PRN Linus Mako, PA-C      . alum & mag hydroxide-simeth (MAALOX/MYLANTA) 200-200-20 MG/5ML suspension 30 mL  30 mL Oral PRN Linus Mako, PA-C      . ibuprofen (ADVIL,MOTRIN) tablet 600 mg  600 mg Oral Q8H PRN Linus Mako, PA-C      . nicotine (NICODERM CQ - dosed in mg/24 hours) patch 21 mg  21 mg Transdermal Daily Tiffany Marilu Favre, PA-C      . ondansetron (ZOFRAN) tablet 4 mg  4 mg Oral Q8H PRN Linus Mako, PA-C      . zolpidem (AMBIEN) tablet 5 mg  5 mg Oral QHS PRN Linus Mako, PA-C       Current Outpatient Prescriptions  Medication Sig Dispense Refill  . albuterol (PROVENTIL HFA;VENTOLIN HFA) 108 (90 BASE) MCG/ACT inhaler Inhale 2 puffs into the lungs every 6 (six) hours as needed for wheezing or shortness of breath.      Marland Kitchen buPROPion (WELLBUTRIN SR) 150 MG 12 hr tablet Take 150 mg by mouth 2 (two) times daily.      Marland Kitchen OLANZapine (ZYPREXA) 20 MG tablet Take 20 mg by mouth at bedtime.      . traZODone (DESYREL) 50 MG tablet Take 50 mg by mouth at bedtime.        Psychiatric Specialty Exam:     Blood pressure 107/74, pulse 121, temperature 98.4 F (36.9 C), temperature source Oral, resp. rate 20, SpO2 98.00%.There is no height or weight on file to calculate BMI.  General Appearance: Well Groomed  Engineer, water::  Good  Speech:  Clear and Coherent  Volume:  Normal  Mood:  Anxious  Affect:  Congruent  Thought Process:  Coherent and Logical  Orientation:  Full (Time, Place, and Person)  Thought Content:  Hallucinations telling him to kill himself  Suicidal Thoughts:  Yes.  with intent/plan  Homicidal Thoughts:  No  Memory:  Immediate;   Good Recent;   Good Remote;   Good  Judgement:  Impaired  Insight:  Lacking  Psychomotor Activity:  Normal  Concentration:  Good  Recall:  Good  Fund of Knowledge:Good  Language: Good  Akathisia:  Negative  Handed:  Right   AIMS (if indicated):   0  Assets:  Communication Skills Social Support  Sleep:  Musculoskeletal: Strength & Muscle Tone: within normal limits Gait & Station: normal Patient leans: N/A  Treatment Plan Summary: Daily contact with patient to assess and evaluate symptoms and progress in treatment Medication management seek inpatient bed to treat his schizophrenia  Clarene Reamer 08/10/2013 4:08 PM

## 2013-08-10 NOTE — ED Notes (Signed)
Pt. Alert and oriented warm and dry in no distress. Pt. C/o SI to cut his wrists. Pt. Denies HI as well as pain. Pt. C/o AVH seeing midgets and different colors. Pt. Encouraged to let nursing staff know if he has concerns or needs.

## 2013-08-10 NOTE — ED Notes (Signed)
Patient continues to endorse SI with plan to cut his wrist.  Also endorses A/V/H with command voices to "hurt himself".  States he lives with a friend and has not been compliant with previously prescribed psychiatric medications.  Denies physical discomfort.  Denies illicit drug use.Support and encouragement given.

## 2013-08-10 NOTE — Progress Notes (Signed)
  CARE MANAGEMENT ED NOTE 08/10/2013  Patient:  Darren [name],Darren [name]   Account Number:  [id number]  Date Initiated:  08/10/2013  Documentation initiated by:  Edd ArbourGIBBS,KIMBERLY  Subjective/Objective Assessment:   37 [place] c/o SI. Pt had plan of cutting wrist and has been having auditory and visual hallucinations. States voices are telling him to hurt himself.     Subjective/Objective Assessment Detail:   no insurance no pcp as confirmed by pt  Homeless     Action/Plan:   ED CM spoke with pt Cm provided [place] resources see not below He request they be given to his nurse to place in his chart [facility] Luann to give to pt at d/c   Action/Plan Detail:   Anticipated DC Date:       Status Recommendation to Physician:   Result of Recommendation:    Other ED Services  Consult Working Psychologist, educationallan    DC Planning Services  Other  PCP issues  Other    Choice offered to / List presented to:            Status of service:  Completed, signed off  ED Comments:   ED Comments Detail:

## 2013-08-10 NOTE — ED Provider Notes (Signed)
Medical screening examination/treatment/procedure(s) were conducted as a shared visit with non-physician practitioner(s) and myself.  I personally evaluated the patient during the encounter.  Pt with schizophrenia and bipolar disorder, c/o hallucinations, increased anxiety/stress and SI.  Pt, alert, speech mildly pressured, anxious. Once back in psych ED, much calmer, hr 84, rr 16. Pt eating sandwich, awaiting psych team eval.      Suzi RootsKevin E Calvert Charland, MD 08/10/13 2006

## 2013-08-11 ENCOUNTER — Encounter (HOSPITAL_COMMUNITY): Payer: Self-pay

## 2013-08-11 ENCOUNTER — Inpatient Hospital Stay (HOSPITAL_COMMUNITY)
Admission: AD | Admit: 2013-08-11 | Discharge: 2013-08-24 | DRG: 885 | Disposition: A | Discharge status: Home / Self Care | Payer: Medicaid Other | Source: Intra-hospital | Attending: Psychiatry | Admitting: Psychiatry

## 2013-08-11 DIAGNOSIS — F29 Unspecified psychosis not due to a substance or known physiological condition: Secondary | ICD-10-CM | POA: Diagnosis present

## 2013-08-11 DIAGNOSIS — R45851 Suicidal ideations: Secondary | ICD-10-CM

## 2013-08-11 DIAGNOSIS — Z880 Allergy status to penicillin: Secondary | ICD-10-CM

## 2013-08-11 DIAGNOSIS — J45909 Unspecified asthma, uncomplicated: Secondary | ICD-10-CM | POA: Diagnosis present

## 2013-08-11 DIAGNOSIS — H5316 Psychophysical visual disturbances: Secondary | ICD-10-CM | POA: Diagnosis present

## 2013-08-11 DIAGNOSIS — Z91199 Patient's noncompliance with other medical treatment and regimen due to unspecified reason: Secondary | ICD-10-CM

## 2013-08-11 DIAGNOSIS — F411 Generalized anxiety disorder: Secondary | ICD-10-CM | POA: Diagnosis present

## 2013-08-11 DIAGNOSIS — F259 Schizoaffective disorder, unspecified: Secondary | ICD-10-CM | POA: Diagnosis present

## 2013-08-11 DIAGNOSIS — F250 Schizoaffective disorder, bipolar type: Secondary | ICD-10-CM | POA: Diagnosis present

## 2013-08-11 DIAGNOSIS — F319 Bipolar disorder, unspecified: Secondary | ICD-10-CM | POA: Diagnosis present

## 2013-08-11 DIAGNOSIS — Z91013 Allergy to seafood: Secondary | ICD-10-CM | POA: Diagnosis not present

## 2013-08-11 DIAGNOSIS — Z9119 Patient's noncompliance with other medical treatment and regimen: Secondary | ICD-10-CM | POA: Diagnosis not present

## 2013-08-11 DIAGNOSIS — G4700 Insomnia, unspecified: Secondary | ICD-10-CM | POA: Diagnosis present

## 2013-08-11 DIAGNOSIS — Z5987 Material hardship due to limited financial resources, not elsewhere classified: Secondary | ICD-10-CM

## 2013-08-11 DIAGNOSIS — Z5989 Other problems related to housing and economic circumstances: Secondary | ICD-10-CM | POA: Diagnosis not present

## 2013-08-11 DIAGNOSIS — IMO0002 Reserved for concepts with insufficient information to code with codable children: Secondary | ICD-10-CM | POA: Diagnosis not present

## 2013-08-11 DIAGNOSIS — Z87891 Personal history of nicotine dependence: Secondary | ICD-10-CM

## 2013-08-11 DIAGNOSIS — Z598 Other problems related to housing and economic circumstances: Secondary | ICD-10-CM

## 2013-08-11 MED ORDER — ACETAMINOPHEN 325 MG PO TABS: 650.0000 mg | ORAL_TABLET | ORAL | Status: DC | PRN

## 2013-08-11 MED ORDER — ESCITALOPRAM OXALATE 10 MG PO TABS
10.0000 mg | ORAL_TABLET | Freq: Every day | ORAL | Status: DC
  Administered 2013-08-12: 10 mg via ORAL
  Filled 2013-08-11 (×2): qty 1

## 2013-08-11 MED ORDER — HYDROXYZINE HCL 25 MG PO TABS
25.0000 mg | ORAL_TABLET | Freq: Four times a day (QID) | ORAL | Status: DC | PRN
  Administered 2013-08-13 – 2013-08-23 (×9): 25 mg via ORAL
  Filled 2013-08-11 (×5): qty 1
  Filled 2013-08-11: qty 20
  Filled 2013-08-11 (×5): qty 1

## 2013-08-11 MED ORDER — MAGNESIUM HYDROXIDE 400 MG/5ML PO SUSP: 30.0000 mL | Freq: Every day | ORAL | Status: DC | PRN

## 2013-08-11 MED ORDER — ACETAMINOPHEN 325 MG PO TABS
650.0000 mg | ORAL_TABLET | Freq: Four times a day (QID) | ORAL | Status: DC | PRN
  Administered 2013-08-20: 650 mg via ORAL

## 2013-08-11 MED ORDER — ACETAMINOPHEN 325 MG PO TABS: 650.0000 mg | ORAL_TABLET | Freq: Four times a day (QID) | ORAL | Status: DC | PRN

## 2013-08-11 MED ORDER — ALUM & MAG HYDROXIDE-SIMETH 200-200-20 MG/5ML PO SUSP: 30.0000 mL | ORAL | Status: DC | PRN

## 2013-08-11 MED ORDER — ALBUTEROL SULFATE HFA 108 (90 BASE) MCG/ACT IN AERS
2.0000 | INHALATION_SPRAY | Freq: Four times a day (QID) | RESPIRATORY_TRACT | Status: DC | PRN
  Administered 2013-08-15 – 2013-08-18 (×4): 2 via RESPIRATORY_TRACT
  Filled 2013-08-11: qty 6.7

## 2013-08-11 MED ORDER — TRAZODONE HCL 50 MG PO TABS
50.0000 mg | ORAL_TABLET | Freq: Every day | ORAL | Status: DC
  Filled 2013-08-11: qty 1

## 2013-08-11 MED ORDER — ONDANSETRON HCL 4 MG PO TABS
4.0000 mg | ORAL_TABLET | Freq: Three times a day (TID) | ORAL | Status: DC | PRN
  Administered 2013-08-15: 4 mg via ORAL
  Filled 2013-08-11: qty 1

## 2013-08-11 MED ORDER — ALUM & MAG HYDROXIDE-SIMETH 200-200-20 MG/5ML PO SUSP
30.0000 mL | ORAL | Status: DC | PRN
  Administered 2013-08-23: 30 mL via ORAL

## 2013-08-11 MED ORDER — OLANZAPINE 10 MG PO TABS
20.0000 mg | ORAL_TABLET | Freq: Every day | ORAL | Status: DC
  Filled 2013-08-11: qty 2

## 2013-08-11 MED ORDER — ALBUTEROL SULFATE HFA 108 (90 BASE) MCG/ACT IN AERS: 2.0000 | INHALATION_SPRAY | Freq: Four times a day (QID) | RESPIRATORY_TRACT | Status: DC | PRN

## 2013-08-11 MED ORDER — OLANZAPINE 5 MG PO TABS
5.0000 mg | ORAL_TABLET | Freq: Every day | ORAL | Status: DC
  Administered 2013-08-11: 5 mg via ORAL
  Filled 2013-08-11: qty 2
  Filled 2013-08-11 (×2): qty 1

## 2013-08-11 MED ORDER — OLANZAPINE 7.5 MG PO TABS
15.0000 mg | ORAL_TABLET | Freq: Every day | ORAL | Status: DC
  Filled 2013-08-11: qty 2

## 2013-08-11 MED ORDER — BISACODYL 5 MG PO TBEC
5.0000 mg | DELAYED_RELEASE_TABLET | Freq: Once | ORAL | Status: AC
  Administered 2013-08-11: 5 mg via ORAL
  Filled 2013-08-11: qty 1

## 2013-08-11 MED ORDER — NICOTINE 21 MG/24HR TD PT24
21.0000 mg | MEDICATED_PATCH | Freq: Every day | TRANSDERMAL | Status: DC
  Filled 2013-08-11 (×12): qty 1

## 2013-08-11 MED ORDER — TRAZODONE HCL 50 MG PO TABS
50.0000 mg | ORAL_TABLET | Freq: Every day | ORAL | Status: DC
  Administered 2013-08-11 – 2013-08-15 (×4): 50 mg via ORAL
  Filled 2013-08-11 (×7): qty 1

## 2013-08-11 NOTE — Progress Notes (Signed)
Pt was given [employer] [facility] Resources and Coping Skills worksheet. Pt was receptive to information given and did not have any questions. This Clinical research associatewriter let pt know if he did have questions to let this Clinical research associatewriter know.

## 2013-08-11 NOTE — ED Notes (Signed)
[facility]. Pts. Belongings sent with [facility].

## 2013-08-11 NOTE — ED Notes (Signed)
Per Tanna SavoyEric AC don't send until after 3:30. [facility] notified.

## 2013-08-11 NOTE — ED Notes (Signed)
Report called to Financial controlleratrice [facility] Accepted by Dr.Akintayo to room 400-2. Will notify [facility] for [facility].

## 2013-08-11 NOTE — ED Notes (Signed)
Pt. Alert and oriented, warm and dry, in no distress. Pt. Denies HI, but still states he has AVH. Pt. States he sees midgets and colors but states they are not telling him anything. Pt. States he is still depressed. Pt. Encouraged to let nursing staff know if he has concerns or needs.

## 2013-08-11 NOTE — ED Notes (Signed)
Notified [facility] of transportation needed to [facility]. 

## 2013-08-11 NOTE — Progress Notes (Signed)
[facility] CL provided pt with a list of primary care resources, highlighting [facility], also provided pt with [facility].

## 2013-08-11 NOTE — BH Assessment (Signed)
Dr. Ladona Ridgelaylor recommends inpatient treatment. [facility] Ladona Ridgelaylor. The attending physician is Dr. Jannifer FranklinAkintayo. Room assignment is 400-2. Support paperwork completed.

## 2013-08-11 NOTE — ED Notes (Signed)
Explained to patient that [facility] due to bed being given to a more acute patient. Stated understanding.

## 2013-08-11 NOTE — Consult Note (Signed)
  [name] says he is still suicidal and hears voices telling him to kill himself.  He says he has not taken medications for several weeks even though his friend who accompanied him yesterday said she was giving it to him daily.  He says Lexapro and Zyprexa worked in the past.  Plan is to re-start these meds and [facility].

## 2013-08-11 NOTE — ED Notes (Signed)
Per Jessie Footoyka [facility].

## 2013-08-11 NOTE — ED Notes (Addendum)
[facility]. Bed required for a more acute patient.

## 2013-08-12 DIAGNOSIS — F319 Bipolar disorder, unspecified: Secondary | ICD-10-CM

## 2013-08-12 DIAGNOSIS — R45851 Suicidal ideations: Secondary | ICD-10-CM

## 2013-08-12 DIAGNOSIS — F259 Schizoaffective disorder, unspecified: Principal | ICD-10-CM

## 2013-08-12 MED ORDER — ESCITALOPRAM OXALATE 20 MG PO TABS: 20.0000 mg | ORAL_TABLET | Freq: Every day | ORAL | Status: DC

## 2013-08-12 MED ORDER — BENZTROPINE MESYLATE 0.5 MG PO TABS
0.5000 mg | ORAL_TABLET | Freq: Two times a day (BID) | ORAL | Status: DC
Start: 2013-08-12 — End: 2013-08-23
  Administered 2013-08-12 – 2013-08-23 (×23): 0.5 mg via ORAL
  Filled 2013-08-12 (×29): qty 1

## 2013-08-12 MED ORDER — OLANZAPINE 10 MG PO TBDP: 10.0000 mg | ORAL_TABLET | Freq: Three times a day (TID) | ORAL | Status: DC | PRN

## 2013-08-12 MED ORDER — DIVALPROEX SODIUM 250 MG PO DR TAB
250.0000 mg | DELAYED_RELEASE_TABLET | Freq: Two times a day (BID) | ORAL | Status: DC
  Administered 2013-08-12 – 2013-08-23 (×22): 250 mg via ORAL
  Filled 2013-08-12 (×30): qty 1

## 2013-08-12 MED ORDER — OLANZAPINE 10 MG PO TBDP
10.0000 mg | ORAL_TABLET | Freq: Three times a day (TID) | ORAL | Status: DC | PRN
  Administered 2013-08-12 – 2013-08-22 (×8): 10 mg via ORAL
  Filled 2013-08-12 (×8): qty 1

## 2013-08-12 MED ORDER — ARIPIPRAZOLE 5 MG PO TABS
5.0000 mg | ORAL_TABLET | Freq: Two times a day (BID) | ORAL | Status: DC
  Administered 2013-08-12 – 2013-08-15 (×7): 5 mg via ORAL
  Filled 2013-08-12 (×13): qty 1
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
## 2013-08-12 NOTE — Progress Notes (Signed)
Pt stated that today was an all right day. He appears to be very flat and depressed. He stated that his goal for tomorrow is to take it one step at a time.

## 2013-08-12 NOTE — Progress Notes (Signed)
Patient ID: Deatra InaMichael Manfredo, male   DOB: 31-Mar-1975, 38 y.o.   MRN: [id number] D: client in bed eyes closed, respirations even. A: Writer assessed for s/s of distress. Staff will monitor q5215min for safety. R: Pt. Is safe on the unit, respirations even,unlabored.
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
## 2013-08-12 NOTE — [facility] Group Notes (Signed)
[facility] Group Notes:  (Counselor/Nursing/MHT/Case Management/Adjunct)  08/12/2013 1:15PM  Type of Therapy:  Group Therapy  Participation Level:  Active  Participation Quality:  Appropriate  Affect:  Flat  Cognitive:  Oriented  Insight:  Improving  Engagement in Group:  Limited  Engagement in Therapy:  Limited  Modes of Intervention:  Discussion, Exploration and Socialization  Summary of Progress/Problems: The topic for group was balance in life.  Pt participated in the discussion about when their life was in balance and out of balance and how this feels.  Pt discussed ways to get back in balance and short term goals they can work on to get where they want to be. Involved in a limited kind of way.  Talked about being invested in treatment and wanting things to be better for himself.  Say he knows that he feels more balanced when he takes his medications, but was not able to identify why he chooses not to.   [name], [name] B 08/12/2013 2:22 PM

## 2013-08-12 NOTE — [facility] Group Notes (Signed)
[facility] Group Notes:  (Nursing/MHT/Case Management/Adjunct)  Date:  08/12/2013  Time:  9:26 AM  Type of Therapy:  Nurse Education  Participation Level:  Minimal  Participation Quality:  Appropriate and Attentive  Affect:  Anxious  Cognitive:  Alert and Appropriate  Insight:  None  Engagement in Group:  Engaged  Modes of Intervention:  Discussion and Education  Summary of Progress/Problems: The point of this group is to discuss the daily subject; Leisure and Lifestyle Changes. RN discusses different ways to begin lifestyle changes and different leisure activities.  Marzetta BoardDopson, Laiden Milles E 08/12/2013, 9:26 AM

## 2013-08-12 NOTE — B[facility] Counselor (Signed)
Adult Psychosocial Assessment Update Interdisciplinary Team  [facility]  Admissions Discharges  Date:02/11/13 Date:  Date: Date:  Date: Date:  Date: Date:  Date: Date:   Changes since the last Psychosocial Assessment (including adherence to outpatient mental health and/or substance abuse treatment, situational issues contributing to decompensation and/or relapse). Pt reports that his depression has been worsening since his last admission.  Pt reported that he was experiencing SI due to his mood instability.  Pt reports that his living situation has been stable.               Discharge Plan 1. Will you be returning to the same living situation after discharge?   Yes: Pt living with pastor, [name] No:      If no, what is your plan?           2. Would you like a referral for services when you are discharged? Yes:     If yes, for what services?  No:       Pt is established with [facility] and will follow-up there upon discharge.       Summary and Recommendations (to be completed by the evaluator) Pt is 38 yr old male vol admitted for SI. Pt has a pmh of schizophrenia. Pt does endorse visual hallucinations of seeing flashes of colors. Pt is currently SI with no plan. Pt does endorse a hx of suicide attempts. Pt declines to elaborate on the factor or situation that provoked his SI. Pt endorses SI, anxiety, depression, and a decrease of appetite. Pt reports a PMH of Asthma, Bipolar D/O, and Schizophrenia. Pt reports to be off his medications for over a month (3-4 mths per assessment note). Pt presented flat in affect and depressed in mood.  Patient will benefit from crisis stabilization, medication evaluation, group therapy and psycho education in addition to case management for discharge planning.                        Signature:  Elaina HoopsCarter, Mylik Pro M, 08/12/2013 10:32 AM

## 2013-08-12 NOTE — Tx Team (Signed)
  Interdisciplinary Treatment Plan Update   Date Reviewed:  08/12/2013  Time Reviewed:  11:05 AM  Progress in Treatment:   Attending groups: Yes Participating in groups: Yes Taking medication as prescribed: Yes  Tolerating medication: Yes Family/Significant other contact made: No Patient understands diagnosis: Yes AEB asking for help with depression, voices Discussing patient identified problems/goals with staff: Yes See initial care plan Medical problems stabilized or resolved: Yes Denies suicidal/homicidal ideation: Yes  In tx team Patient has not harmed self or others: Yes  For review of initial/current patient goals, please see plan of care.  Estimated Length of Stay:  4-5 days  Reason for Continuation of Hospitalization: Depression Hallucinations Medication stabilization Suicidal ideation  New Problems/Goals identified:  N/A  Discharge Plan or Barriers:   return home, follow up outpt  Additional Comments: [name] was accompanied by his friend. He says he hears voices telling him to kill himself and has heard these voices for years. They Have gotten worse recently even though he takes 20 mg of Zyprexa daily. His friend says she gives it to him so she knows he takes it. She says he decompensates every month about this time. His friend says he is bored because he [place] not know anybody in [place]. [place] start [place] new ministry.   Attendees:  Signature: Thedore MinsMojeed Akintayo, MD 08/12/2013 11:05 AM   Signature: Richelle Itood Javel Hersh, LCSW 08/12/2013 11:05 AM  Signature: Fransisca KaufmannLaura Davis, NP 08/12/2013 11:05 AM  Signature: Joslyn Devonaroline Beaudry, RN 08/12/2013 11:05 AM  Signature: Liborio NixonPatrice White, RN 08/12/2013 11:05 AM  Signature:  08/12/2013 11:05 AM  Signature:   08/12/2013 11:05 AM  Signature:    Signature:    Signature:    Signature:    Signature:    Signature:      Scribe for Treatment Team:   Richelle Itood Yailine Ballard, LCSW  08/12/2013 11:05 AM

## 2013-08-12 NOTE — Progress Notes (Signed)
Patient ID: Darren [name], male   DOB: 1975/04/10, 38 y.o.   MRN: [id number]  D: Pt. Denies homicidal ideation to this Clinical research associatewriter. Patient reports passive SI with no plan but can contract for safety. Patient reports that he hears mumbling voices that are loud and sees what appears to be a movie in his head. Patient reports that he cannot hear what the voices are saying. Patient does not report any pain or discomfort at this time. Patient reports that he slept fair, his appetite is fair, and his energy level is normal.   A: Support and encouragement provided to the patient to come to writer about any questions or concerns. Scheduled medications administered to patient per physician's orders.  R: Patient is receptive and cooperative. Patient can be seen in the milieu and is attending groups. Q15 minute checks are maintained for safety.

## 2013-08-12 NOTE — Tx Team (Signed)
Initial Interdisciplinary Treatment Plan   PATIENT STRESSORS: Health problems Loss of uncle Medication change or noncompliance   PROBLEM LIST: Problem List/Patient Goals Date to be addressed Date deferred Reason deferred Estimated date of resolution  Increased risk for SI 7/29     Depression 7/29     Anxiety 7/29                                          DISCHARGE CRITERIA:  Improved stabilization in mood, thinking, and/or behavior Motivation to continue treatment in a less acute level of care Need for constant or close observation no longer present Reduction of life-threatening or endangering symptoms to within safe limits Verbal commitment to aftercare and medication compliance  PRELIMINARY DISCHARGE PLAN: Attend PHP/IOP Outpatient therapy  PATIENT/FAMIILY INVOLVEMENT: This treatment plan has been presented to and reviewed with the patient, Darren [name].  The patient and family have been given the opportunity to ask questions and make suggestions.  [name] A 08/12/2013, 6:05 AM

## 2013-08-12 NOTE — Progress Notes (Signed)
Pt is 38 yr old male vol admitted for SI. Pt has a pmh of schizophrenia. Pt does endorse visual hallucinations of seeing flashes of colors.  Pt is currently SI with no plan. Pt does endorse a hx of suicide attempts. Pt declines to elaborate on the factor or situation that provoked his SI. Pt endorses SI, anxiety, depression, and a decrease of appetite. Pt reports a PMH of Asthma, Bipolar D/O, and Schizophrenia. Pt reports to be off his medications for over a month (3-4 mths per assessment note). Pt presented flat in affect and depressed in mood. Pt was cooperative and signed all appropriate paperwork. Food and fluids offered and given to pt. Pt remains safe at this time with q5015min checks.

## 2013-08-13 MED ORDER — ENSURE COMPLETE PO LIQD
237.0000 mL | Freq: Two times a day (BID) | ORAL | Status: DC
  Administered 2013-08-13 – 2013-08-23 (×21): 237 mL via ORAL

## 2013-08-13 NOTE — [facility] Group Notes (Signed)
[facility]   08/13/2013 10:01 AM  Participation Quality:  Minimal   Mood/Affect:  Lethargic  Depression Rating:  Depression is decreasing per patient   Anxiety Rating:  Anxiety is decreasing per patient  Thoughts of Suicide:  No Will you contract for safety?   Yes  Current AVH:  No  Plan for Discharge/Comments:  Patient reports that he will return to his friend Archie Pattenonya Joseph's house upon discharge. Plan will need to be verified prior to time of discharge.   Transportation Means: Bus pass/friend  Supports: Unspecified   PICKETT JR, Taronda Comacho C

## 2013-08-13 NOTE — Progress Notes (Signed)
NUTRITION ASSESSMENT  Pt identified as at risk on the Malnutrition Screen Tool  INTERVENTION: 1. Educated patient on the importance of nutrition and encouraged intake of food and beverages. 2. Ensure Complete BID  NUTRITION DIAGNOSIS: Unintentional weight loss related to sub-optimal intake as evidenced by pt report.   Goal: Pt to meet >/= 90% of their estimated nutrition needs.  Monitor:  PO intake  Assessment:  Pt with history of schizophrenia who presented to the [facility] reporting auditory and visual hallucinations.  - Met with pt who reports he used to eat a lot at home, 3-4 plates of food, but since admission he has been eating <50% of meals  - Thinks he's lost 5-6 pounds recently, unsure of time frame - Agreeable to getting Ensure Complete, will order    38 y.o. male  Height: Ht Readings from Last 1 Encounters:  08/11/13 '5\' 5"'  (1.651 m)    Weight: Wt Readings from Last 1 Encounters:  08/11/13 154 lb (69.854 kg)    Weight Hx: Wt Readings from Last 10 Encounters:  08/11/13 154 lb (69.854 kg)    BMI:  Body mass index is 25.63 kg/(m^2). Pt meets criteria for overweight based on current BMI.  Estimated Nutritional Needs: Kcal: 25-30 kcal/kg Protein: > 1 gram protein/kg Fluid: 1 ml/kcal  Diet Order: General Pt is also offered choice of unit snacks mid-morning and mid-afternoon.  Pt is eating as desired.   Lab results and medications reviewed.   Carlis Stable MS, Brady, LDN [phone] Pager [phone] Weekend/After Hours Pager

## 2013-08-13 NOTE — [facility] Group Notes (Signed)
[facility] LCSW Group Therapy  08/13/2013 2:31 PM  Type of Therapy:  Group therapy  Participation Level:  Active  Participation Quality:  Attentive  Affect:  Flat  Cognitive:  Oriented  Insight:  Limited  Engagement in Therapy:  Limited  Modes of Intervention:  Discussion, Socialization  Summary of Progress/Problems:  Chaplain was here to lead a group on themes of hope and courage. [name] was observed to be active within today's group as he reported that his courage comes from within and from self ambition to handle any situation. [name] stated that he perceives having courage to be in a position where one accepts the challenge that has been presented to them. He was observed to be actively engaged in group AEB actively participating without prompting. Mood continues to present as euthymic.   Paulino DoorICKETT JR, Braydee Shimkus C 08/13/2013, 2:31 PM

## 2013-08-13 NOTE — Progress Notes (Signed)
Patient ID: Darren [name], male   DOB: 1975-06-11, 38 y.o.   MRN: [id number] D. Patient presents with depressed mood, affect congruent. Darren [name] continues to be very depressed, and appears to be responding to internal stimuli. Pt at medication window with writer this morning with wide eyed stare. He would not verbally answer writer any questions, only shaking his head in yes or no fashion. He continued to dart and look over shoulder. He has remained very isolative to his room throughout shift. A. Medications given, support and encouragement provided. Discussed patients behaviors with Dr. Jannifer FranklinAkintayo and treatment team. R. Pt remains safe at this time. Will continue to monitor q 15 minutes for safety.

## 2013-08-13 NOTE — Progress Notes (Addendum)
[facility]  08/13/2013 11:24 AM Darren [name]  MRN:  [id number] Subjective:   Patient states "I feel very sad. I did not sleep well. I still hear a lot of people talking. I see people too. They look like midgets. My depression is an eight today."   Objective:  Patient seen and chart is reviewed. The patient was assessed in his room this morning. Darren [name] is experiencing ongoing symptoms of psychosis. Nursing staff report that he is frequently observed responding to internal stimuli. Patient reports feeling very depressed over his psychotic symptoms. He is compliant with his psychiatric medications and denies any adverse effects stating "So far so good."   Diagnosis:   DSM5: Total Time spent with patient: 20 minutes  AXIS I: Schizoaffective disorder, bipolar type  AXIS II: Deferred  AXIS III:  Past Medical History   Diagnosis  Date   .  Schizophrenia    .  Bipolar disorder (manic depression)    .  Asthma     AXIS IV: economic problems, occupational problems, other psychosocial or environmental problems and problems with primary support group  AXIS V: 31-40 impairment in reality testing   ADL's:  Intact  Sleep: Good  Appetite:  Fair  Suicidal Ideation:  Passive SI with no plan Homicidal Ideation:  Denies  AEB (as evidenced by):  Psychiatric Specialty Exam: Physical Exam  Review of Systems  Constitutional: Negative.   HENT: Negative.   Eyes: Negative.   Respiratory: Negative.   Cardiovascular: Negative.   Gastrointestinal: Negative.   Genitourinary: Negative.   Musculoskeletal: Negative.   Skin: Negative.   Neurological: Negative.   Endo/Heme/Allergies: Negative.   Psychiatric/Behavioral: Positive for depression, suicidal ideas and hallucinations. The patient is nervous/anxious.     Blood pressure 106/71, pulse 93, temperature 97.6 F (36.4 C), temperature source Oral, resp. rate 16, height 5\' 5"  (1.651 m), weight 69.854 kg (154 lb).Body mass index is 25.63  kg/(m^2).  General Appearance: Disheveled  Eye Contact::  Minimal  Speech:  Slow  Volume:  Decreased  Mood:  Dysphoric  Affect:  Constricted  Thought Process:  Circumstantial and Disorganized  Orientation:  Full (Time, Place, and Person)  Thought Content:  Delusions and Hallucinations: Auditory Visual  Suicidal Thoughts:  Yes.  without intent/plan  Homicidal Thoughts:  No  Memory:  Immediate;   Fair Recent;   Fair Remote;   Fair  Judgement:  Impaired  Insight:  Shallow  Psychomotor Activity:  Decreased  Concentration:  Fair  Recall:  Darren [name]  Fund of Knowledge:Fair  Language: Good  Akathisia:  No  Handed:  Right  AIMS (if indicated):     Assets:  Communication Skills Desire for Improvement Physical Health  Sleep:  Number of Hours: 6.75   Musculoskeletal: Strength & Muscle Tone: within normal limits Gait & Station: normal Patient leans: N/A  Current Medications: Current Facility-Administered Medications  Medication Dose Route Frequency Provider Last Rate Last Dose  . acetaminophen (TYLENOL) tablet 650 mg  650 mg Oral Q6H PRN Darren HoughSpencer E Simon, PA-C      . albuterol (PROVENTIL HFA;VENTOLIN HFA) 108 (90 BASE) MCG/ACT inhaler 2 puff  2 puff Inhalation Q6H PRN Darren HoughSpencer E Simon, PA-C      . alum & mag hydroxide-simeth (MAALOX/MYLANTA) 200-200-20 MG/5ML suspension 30 mL  30 mL Oral Q4H PRN Darren PottGerald D Taylor, MD      . ARIPiprazole (ABILIFY) tablet 5 mg  5 mg Oral BID PC Darren [name]   5 mg at 08/13/13 0759  .  Darren [name] (COGENTIN) tablet 0.5 mg  0.5 mg Oral BID Darren [name]   0.5 mg at 08/13/13 0759  . divalproex (DEPAKOTE) DR tablet 250 mg  250 mg Oral BID PC Darren [name]   250 mg at 08/13/13 0759  . feeding supplement (ENSURE COMPLETE) (ENSURE COMPLETE) liquid 237 mL  237 mL Oral BID BM Darren [name], RD      . hydrOXYzine (ATARAX/VISTARIL) tablet 25 mg  25 mg Oral Q6H PRN Darren Hough, PA-C      . magnesium hydroxide (MILK OF MAGNESIA) suspension 30 mL  30 mL Oral  Daily PRN Darren Hough, PA-C      . nicotine (NICODERM CQ - dosed in mg/24 hours) patch 21 mg  21 mg Transdermal Daily Darren Pott, MD      . OLANZapine zydis (ZYPREXA) disintegrating tablet 10 mg  10 mg Oral Q8H PRN Darren [name]   10 mg at 08/12/13 2152  . ondansetron (ZOFRAN) tablet 4 mg  4 mg Oral Q8H PRN Darren Pott, MD      . traZODone (DESYREL) tablet 50 mg  50 mg Oral QHS Darren Hough, PA-C   50 mg at 08/12/13 2152    Lab Results: No results found for this or any previous visit (from the past 48 hour(s)).  Physical Findings: AIMS: Facial and Oral Movements Muscles of Facial Expression: None, normal Lips and Perioral Area: None, normal Jaw: None, normal Tongue: None, normal,Extremity Movements Upper (arms, wrists, hands, fingers): None, normal Lower (legs, knees, ankles, toes): None, normal, Trunk Movements Neck, shoulders, hips: None, normal, Overall Severity Severity of abnormal movements (highest score from questions above): None, normal Incapacitation due to abnormal movements: None, normal Patient's awareness of abnormal movements (rate only patient's report): No Awareness,    CIWA:    COWS:     Treatment Plan Summary: Daily contact with patient to assess and evaluate symptoms and progress in treatment Medication management  Plan:  1. Continue crisis management and stabilization.  2. Medication management:  -Continue Abilify 5 mg BID for psychosis -Continue Cogentin 0.5 mg BID for EPS prevention -Continue Trazodone 50 mg hs for insomnia.  3. Encouraged patient to attend groups and participate in group counseling sessions and activities.  4. Discharge plan in progress.  5. Continue current treatment plan.  6. Address health issues: Continue albuterol two puffs every six hours prn wheezing or shortness of breath related to asthma.   Medical Decision Making Problem Points:  Established problem, worsening (2), Review of last therapy session (1) and  Review of psycho-social stressors (1) Data Points:  Order Aims Assessment (2) Review or order clinical lab tests (1) Review of medication regiment & side effects (2) Review of new medications or change in dosage (2)  I certify that inpatient services furnished can reasonably be expected to improve the patient's condition.   Fransisca Kaufmann NP-C 08/13/2013, 11:24 AM  Patient seen, evaluated and I agree with notes by Nurse Practitioner. Thedore Mins, MD

## 2013-08-14 NOTE — Progress Notes (Signed)
Patient ID: Darren [name], male   DOB: 07/07/1975, 38 y.o.   MRN: [id number] D)  Has been in the dayroom watching tv and also on the hall walking this evening.  Came to med window after a short group and snack, admits to hearing voices but walked away, went to his room and went to bed. A)  Will continue to monitor for safety, continue POC R)  Safety maintained

## 2013-08-14 NOTE — Progress Notes (Addendum)
Patient ID: Darren [name], male   DOB: 02/12/1975, 38 y.o.   MRN: [id number] D. Patient presents with depressed mood, affect congruent. Darren [name] continues to be very anxious, and suspicious. He does continue to endorse auditory hallucinations stating '' oh yeah lots of noise, they are kind of bothering me today. '' He continues to look very scared and anxious at times, and is not forthcoming of his thoughts. His speech is soft and slowed.He rates his depression at 8/10 on depression scale, 10 being worst. He continues to endorse passive SI. Verbal CFS. A. Medications given, support and encouragement provided. Discussed patients behaviors with Dr. Jannifer FranklinAkintayo , and prn medications given for paranoia/anxiety. Patient did report good results.. R. Pt remains safe at this time. Will continue to monitor q 15 minutes for safety.

## 2013-08-14 NOTE — Progress Notes (Signed)
The focus of this group is to help patients review their daily goal of treatment and discuss progress on daily workbooks. Pt attended the evening group session and responded to all discussion prompts from the Writer. Pt told the group the highlight of his day was getting to play basketball outside in the courtyard after having not played in a while. "I've still got it!' Pt requested towels, gowns and soap from the Writer, which were given to him following group. Pt told the Writer that his goal for the coming week was to keep moving forward with his recovery. "I plan on making any steps I can, even baby steps, so long as they're in the right direction." Pt's affect was appropriate.

## 2013-08-14 NOTE — [facility] Group Notes (Signed)
[facility] Group Notes:  (Clinical Social Work)  08/14/2013  11:00-11:45AM  Summary of Progress/Problems:   The main focus of today's process group was for the patient to identify ways in which they have in the past sabotaged their own recovery and reasons they may have done this/what they received from doing it.  We then worked to identify a specific plan to avoid doing this when discharged from the hospital for this admission.  The patient expressed that he knows he needs to stay on his medications, but sometimes he forgets to take them.  He was unable to come up with a plan to prevent that, did not respond to CSW's suggestions.  Type of Therapy:  Group Therapy - Process  Participation Level:  Active  Participation Quality:  Attentive  Affect:  Blunted  Cognitive:  Appropriate  Insight:  Developing/Improving  Engagement in Therapy:  Developing/Improving  Modes of Intervention:  Clarification, Education, Exploration, Discussion  Ambrose MantleMareida Grossman-Orr, LCSW 08/14/2013, 4:42 PM

## 2013-08-14 NOTE — Progress Notes (Signed)
Patient ID: Darren [name], male   DOB: August 21, 1975, 38 y.o.   MRN: [id number] [facility]  08/14/2013 3:17 PM Darren [name]  MRN:  [id number] Subjective:   Patient states "I am doing better now but still need ."  Objective:  Patient  Was seen resting in the day room.  Patient reports that he is doing much better since starting back on his medication.  Patient states "Life is too challenging"  Patient reports he is enjoying participating in group and taking his medications.  Patient reports sleeping well and eating well.  He is still having auditory and visual hallucination.  He denies SI/HI/AVH.  We will continue to monitor patient and offer  Him his  medications.   Diagnosis:   DSM5: Total Time spent with patient: 20 minutes  AXIS I: Schizoaffective disorder, bipolar type  AXIS II: Deferred  AXIS III:  Past Medical History   Diagnosis  Date   .  Schizophrenia    .  Bipolar disorder (manic depression)    .  Asthma     AXIS IV: economic problems, occupational problems, other psychosocial or environmental problems and problems with primary support group  AXIS V: 31-40 impairment in reality testing   ADL's:  Intact  Sleep: Good  Appetite:  Fair  Suicidal Ideation:  Passive SI with no plan Homicidal Ideation:  Denies  AEB (as evidenced by):  Psychiatric Specialty Exam: Physical Exam  Review of Systems  Constitutional: Negative.   HENT: Negative.   Eyes: Negative.   Respiratory: Negative.   Cardiovascular: Negative.   Gastrointestinal: Negative.   Genitourinary: Negative.   Musculoskeletal: Negative.   Skin: Negative.   Neurological: Negative.   Endo/Heme/Allergies: Negative.   Psychiatric/Behavioral: Positive for depression, suicidal ideas and hallucinations. The patient is nervous/anxious.     Blood pressure 116/69, pulse 95, temperature 98.3 F (36.8 C), temperature source Oral, resp. rate 20, height 5\' 5"  (1.651 m), weight 69.854 kg (154 lb).Body mass index  is 25.63 kg/(m^2).  General Appearance: Moderately groomed, alert and cooperative  Eye Contact::  Minimal  Speech:  Slow  Volume:  Decreased  Mood:  Dysphoric  Affect:  Constricted  Thought Process:  Circumstantial and Disorganized  Orientation:  Full (Time, Place, and Person)  Thought Content:  Delusions and Hallucinations: Auditory Visual  Suicidal Thoughts:  Yes.  without intent/plan  Homicidal Thoughts:  No  Memory:  Immediate;   Fair Recent;   Fair Remote;   Fair  Judgement:  Impaired  Insight:  Shallow  Psychomotor Activity:  Decreased  Concentration:  Fair  Recall:  [employer]Fair  Fund of Knowledge:Fair  Language: Good  Akathisia:  No  Handed:  Right  AIMS (if indicated):     Assets:  Communication Skills Desire for Improvement Physical Health  Sleep:  Number of Hours: 6.75   Musculoskeletal: Strength & Muscle Tone: within normal limits Gait & Station: normal Patient leans: N/A  Current Medications: Current Facility-Administered Medications  Medication Dose Route Frequency Provider Last Rate Last Dose  . acetaminophen (TYLENOL) tablet 650 mg  650 mg Oral Q6H PRN Kerry HoughSpencer E Simon, PA-C      . albuterol (PROVENTIL HFA;VENTOLIN HFA) 108 (90 BASE) MCG/ACT inhaler 2 puff  2 puff Inhalation Q6H PRN Kerry HoughSpencer E Simon, PA-C      . alum & mag hydroxide-simeth (MAALOX/MYLANTA) 200-200-20 MG/5ML suspension 30 mL  30 mL Oral Q4H PRN Benjaman PottGerald D Taylor, MD      . ARIPiprazole (ABILIFY) tablet 5 mg  5  mg Oral BID PC Delos Klich   5 mg at 08/14/13 0741  . benztropine (COGENTIN) tablet 0.5 mg  0.5 mg Oral BID Marquisha Nikolov   0.5 mg at 08/14/13 0741  . divalproex (DEPAKOTE) DR tablet 250 mg  250 mg Oral BID PC Mercedez Boule   250 mg at 08/14/13 0741  . feeding supplement (ENSURE COMPLETE) (ENSURE COMPLETE) liquid 237 mL  237 mL Oral BID BM Tenny Craw, RD   237 mL at 08/14/13 1341  . hydrOXYzine (ATARAX/VISTARIL) tablet 25 mg  25 mg Oral Q6H PRN Kerry Hough, PA-C   25 mg at  08/14/13 0741  . magnesium hydroxide (MILK OF MAGNESIA) suspension 30 mL  30 mL Oral Daily PRN Kerry Hough, PA-C      . nicotine (NICODERM CQ - dosed in mg/24 hours) patch 21 mg  21 mg Transdermal Daily Benjaman Pott, MD      . OLANZapine zydis (ZYPREXA) disintegrating tablet 10 mg  10 mg Oral Q8H PRN Ruxin Ransome   10 mg at 08/14/13 0741  . ondansetron (ZOFRAN) tablet 4 mg  4 mg Oral Q8H PRN Benjaman Pott, MD      . traZODone (DESYREL) tablet 50 mg  50 mg Oral QHS Kerry Hough, PA-C   50 mg at 08/13/13 2212    Lab Results: No results found for this or any previous visit (from the past 48 hour(s)).  Physical Findings: AIMS: Facial and Oral Movements Muscles of Facial Expression: None, normal Lips and Perioral Area: None, normal Jaw: None, normal Tongue: None, normal,Extremity Movements Upper (arms, wrists, hands, fingers): None, normal Lower (legs, knees, ankles, toes): None, normal, Trunk Movements Neck, shoulders, hips: None, normal, Overall Severity Severity of abnormal movements (highest score from questions above): None, normal Incapacitation due to abnormal movements: None, normal Patient's awareness of abnormal movements (rate only patient's report): No Awareness,    CIWA:    COWS:     Treatment Plan Summary: Daily contact with patient to assess and evaluate symptoms and progress in treatment Medication management  Plan:  1. Continue crisis management and stabilization.  2. Medication management:  -Continue Abilify 5 mg BID for psychosis -Continue Cogentin 0.5 mg BID for EPS prevention -Continue Trazodone 50 mg hs for insomnia.  -Olanzapine Zydis 10 mg po every 8 hours as needed for agitation -Depakote 250 mg po BID PC FOR MOOD STABILIZATION 3. Encouraged patient to attend groups and participate in group counseling sessions and activities.  4. Discharge plan in progress.  5. Continue current treatment plan.  6. Address health issues: Continue albuterol  two puffs every six hours prn wheezing or shortness of breath related to asthma.   Medical Decision Making Problem Points:  Established problem, worsening (2), Review of last therapy session (1) and Review of psycho-social stressors (1) Data Points:  Order Aims Assessment (2) Review or order clinical lab tests (1) Review of medication regiment & side effects (2) Review of new medications or change in dosage (2)  I certify that inpatient services furnished can reasonably be expected to improve the patient's condition.   Dahlia Byes, C   PMHNP-BC 08/14/2013, 3:17 PM   Patient seen, evaluated and I agree with notes by Nurse Practitioner. Thedore Mins, MD

## 2013-08-14 NOTE — Progress Notes (Signed)
Patient ID: Deatra InaMichael Sarin, male   DOB: 14-Oct-1975, 38 y.o.   MRN: [id number] Psychoeducational Group Note  Date:  08/14/2013 Time:0900am  Group Topic/Focus:  Identifying Needs:   The focus of this group is to help patients identify their personal needs that have been historically problematic and identify healthy behaviors to address their needs.  Participation Level:  Active  Participation Quality:  Appropriate  Affect:  Appropriate  Cognitive:  Appropriate  Insight:  Supportive  Engagement in Group:  Supportive  Additional Comments:  Inventory group   Valente DavidWeaver, Devaughn Savant Brooks 08/14/2013,3:41 PM

## 2013-08-14 NOTE — Progress Notes (Signed)
Patient ID: Darren [name], male   DOB: 1975/06/23, 38 y.o.   MRN: [id number] Psychoeducational Group Note  Date:  08/14/2013 Time:0920am  Group Topic/Focus:  Identifying Needs:   The focus of this group is to help patients identify their personal needs that have been historically problematic and identify healthy behaviors to address their needs.  Participation Level:  Active  Participation Quality:  Appropriate  Affect:  Appropriate  Cognitive:  Appropriate  Insight:  Supportive  Engagement in Group:  Supportive  Additional Comments:  Healthy coping skills.   Darren [name], Darren [name] 08/14/2013,3:44 PM

## 2013-08-15 MED ORDER — ARIPIPRAZOLE 10 MG PO TABS
10.0000 mg | ORAL_TABLET | Freq: Two times a day (BID) | ORAL | Status: DC
  Administered 2013-08-15 – 2013-08-16 (×2): 10 mg via ORAL
  Filled 2013-08-15 (×4): qty 1

## 2013-08-15 NOTE — Progress Notes (Signed)
Patient ID: Darren [name], male   DOB: 1975-11-04, 38 y.o.   MRN: [id number] D. [name] presents with depressed mood, affect congruent again today. Micheal continues to be very anxious, and suspicious. He continues to have wide eyed stare, and reports feeling paranoid. He remains guarded and is not forthcoming of thoughts - but does agree that he is hearing ''voices and noises''   He continues to endorse increased anxiety. His speech remains very soft at times. He rates his depression at 7/10 on depression scale, 10 being worst,today. He denied any pain during assessment but wrote on self inventory 'chest pain' writer assessed and patient in no acute distress, V/S WNL. Denies SI/HI. A. Medications given, support and encouragement provided. Discussed above information with Dr. Jannifer FranklinAkintayo , and prn medications given for paranoia/anxiety. Patient did report good results.. R. Pt remains safe at this time. Will continue to monitor q 15 minutes for safety.

## 2013-08-15 NOTE — Progress Notes (Signed)
Patient ID: Darren [name], male   DOB: 07-20-1975, 38 y.o.   MRN: [id number] Psychoeducational Group Note  Date:  08/15/2013 Time:  0920  Group Topic/Focus:  healthy support systems.   Participation Level: Did Not Attend  Participation Quality:  Not Applicable  Affect:  Not Applicable  Cognitive:  Not Applicable  Insight:  Not Applicable  Engagement in Group: Not Applicable  Additional Comments:  Did not attend.   Valente DavidWeaver, Krisna Omar Brooks 08/15/2013, 10:17 AM

## 2013-08-15 NOTE — [facility] Group Notes (Signed)
[facility] Group Notes:  (Clinical Social Work)  08/15/2013   11:15am-12:00pm  Summary of Progress/Problems:  The main focus of today's process group was to listen to a variety of genres of music and to identify that different types of music provoke different responses.  The patient then was able to identify personally what was soothing for them, as well as energizing.  Handouts were used to record feelings evoked, as well as how patient can personally use this knowledge in sleep habits, with depression, and with other symptoms.  The patient expressed understanding of concepts, as well as knowledge of how each type of music affected him/her and how this can be used at home as a wellness/recovery tool.  Darren [name] knew the words to most of the music, and sang along.  His affect was broader, and he indicated great enjoyment, ability to use music positively.  Type of Therapy:  Music Therapy   Participation Level:  Active  Participation Quality:  Attentive and Sharing  Affect:  Blunted  Cognitive:  Oriented  Insight:  Engaged  Engagement in Therapy:  Engaged  Modes of Intervention:   Activity, Exploration  Darren MantleMareida Grossman-Orr, LCSW 08/15/2013, 12:30pm

## 2013-08-15 NOTE — Progress Notes (Signed)
Patient ID: Darren [name], male   DOB: [birth year], 38 y.o.   MRN: [id number] Psychoeducational Group Note  Date:  08/15/2013 Time:  0900 Group Topic/Focus:  inventory group   Participation Level: Did Not Attend  Participation Quality:  Not Applicable  Affect:  Not Applicable  Cognitive:  Not Applicable  Insight:  Not Applicable  Engagement in Group: Not Applicable  Additional Comments:  Did not attend.   Valente DavidWeaver, Tye Juarez Brooks 08/15/2013, 10:16 AM

## 2013-08-15 NOTE — Progress Notes (Signed)
Patient ID: Darren [name], male   DOB: 05-Oct-1975, 38 y.o.   [facility] [id number] [facility]  08/15/2013 1:18 PM Darren [name]  MRN:  [id number] Subjective:   Patient states "I am still feeling depressed, hearing voices and seeing things.''  Objective:  Patient seen and his chart was reviewed.  Patient reports ongoing anxiety, psychosis, delusional thinking and depressive symptoms. Darren [name] continues to be very anxious, and suspicious. He remains guarded, withdrawn with minimal interactions with staffs and peers. Patient  rates his depression and anxiety at 7/10 . However, patient is compliant with his medications and has not verbalized any adverse reactions to her husband.  Diagnosis:   DSM5: Total Time spent with patient: 20 minutes  AXIS I: Schizoaffective disorder, bipolar type  AXIS II: Deferred  AXIS III:  Past Medical History   Diagnosis  Date   .  Schizophrenia    .  Bipolar disorder (manic depression)    .  Asthma     AXIS IV: economic problems, occupational problems, other psychosocial or environmental problems and problems with primary support group  AXIS V: 31-40 impairment in reality testing   ADL's:  Intact  Sleep: Good  Appetite:  Fair  Suicidal Ideation:  Passive SI with no plan Homicidal Ideation:  Denies  AEB (as evidenced by):  Psychiatric Specialty Exam: Physical Exam  Review of Systems  Constitutional: Negative.   HENT: Negative.   Eyes: Negative.   Respiratory: Negative.   Cardiovascular: Negative.   Gastrointestinal: Negative.   Genitourinary: Negative.   Musculoskeletal: Negative.   Skin: Negative.   Neurological: Negative.   Endo/Heme/Allergies: Negative.   Psychiatric/Behavioral: Positive for depression, suicidal ideas and hallucinations. The patient is nervous/anxious.     Blood pressure 103/70, pulse 78, temperature 97.6 F (36.4 C), temperature source Oral, resp. rate 20, height 5\' 5"  (1.651 m), weight 69.854 kg (154 lb).Body mass  index is 25.63 kg/(m^2).  General Appearance: Disheveled  Eye Contact::  Minimal  Speech:  Slow  Volume:  Decreased  Mood:  Dysphoric  Affect:  Constricted  Thought Process:  Circumstantial and Disorganized  Orientation:  Full (Time, Place, and Person)  Thought Content:  Delusions and Hallucinations: Auditory Visual  Suicidal Thoughts:  Yes.  without intent/plan  Homicidal Thoughts:  No  Memory:  Immediate;   Fair Recent;   Fair Remote;   Fair  Judgement:  Impaired  Insight:  Shallow  Psychomotor Activity:  Decreased  Concentration:  Fair  Recall:  [employer]Fair  Fund of Knowledge:Fair  Language: Good  Akathisia:  No  Handed:  Right  AIMS (if indicated):     Assets:  Communication Skills Desire for Improvement Physical Health  Sleep:  Number of Hours: 6.75   Musculoskeletal: Strength & Muscle Tone: within normal limits Gait & Station: normal Patient leans: N/A  Current Medications: Current Facility-Administered Medications  Medication Dose Route Frequency Provider Last Rate Last Dose  . acetaminophen (TYLENOL) tablet 650 mg  650 mg Oral Q6H PRN Kerry HoughSpencer E Simon, PA-C      . albuterol (PROVENTIL HFA;VENTOLIN HFA) 108 (90 BASE) MCG/ACT inhaler 2 puff  2 puff Inhalation Q6H PRN Kerry HoughSpencer E Simon, PA-C      . alum & mag hydroxide-simeth (MAALOX/MYLANTA) 200-200-20 MG/5ML suspension 30 mL  30 mL Oral Q4H PRN Benjaman PottGerald D Taylor, MD      . ARIPiprazole (ABILIFY) tablet 10 mg  10 mg Oral BID PC Dorcas Melito      . benztropine (COGENTIN) tablet 0.5 mg  0.5  mg Oral BID Lashane Whelpley   0.5 mg at 08/15/13 0809  . divalproex (DEPAKOTE) DR tablet 250 mg  250 mg Oral BID PC Caterina Racine   250 mg at 08/15/13 0809  . feeding supplement (ENSURE COMPLETE) (ENSURE COMPLETE) liquid 237 mL  237 mL Oral BID BM Tenny Craw, RD   237 mL at 08/14/13 1341  . hydrOXYzine (ATARAX/VISTARIL) tablet 25 mg  25 mg Oral Q6H PRN Kerry Hough, PA-C   25 mg at 08/15/13 0809  . magnesium hydroxide (MILK OF  MAGNESIA) suspension 30 mL  30 mL Oral Daily PRN Kerry Hough, PA-C      . nicotine (NICODERM CQ - dosed in mg/24 hours) patch 21 mg  21 mg Transdermal Daily Benjaman Pott, MD      . OLANZapine zydis (ZYPREXA) disintegrating tablet 10 mg  10 mg Oral Q8H PRN Moises Terpstra   10 mg at 08/15/13 0809  . ondansetron (ZOFRAN) tablet 4 mg  4 mg Oral Q8H PRN Benjaman Pott, MD      . traZODone (DESYREL) tablet 50 mg  50 mg Oral QHS Kerry Hough, PA-C   50 mg at 08/13/13 2212    Lab Results: No results found for this or any previous visit (from the past 48 hour(s)).  Physical Findings: AIMS: Facial and Oral Movements Muscles of Facial Expression: None, normal Lips and Perioral Area: None, normal Jaw: None, normal Tongue: None, normal,Extremity Movements Upper (arms, wrists, hands, fingers): None, normal Lower (legs, knees, ankles, toes): None, normal, Trunk Movements Neck, shoulders, hips: None, normal, Overall Severity Severity of abnormal movements (highest score from questions above): None, normal Incapacitation due to abnormal movements: None, normal Patient's awareness of abnormal movements (rate only patient's report): No Awareness,    CIWA:    COWS:     Treatment Plan Summary: Daily contact with patient to assess and evaluate symptoms and progress in treatment Medication management  Plan:  1. Continue crisis management and stabilization.  2. Medication management:  -Increase  Abilify 10mg  BID for psychosis/depression -Continue Cogentin 0.5 mg BID for EPS prevention -Continue Trazodone 50 mg hs for insomnia.  3. Encouraged patient to attend groups and participate in group counseling sessions and activities.  4. Discharge plan in progress.  5. Continue current treatment plan.  6. Address health issues: Continue albuterol two puffs every six hours prn wheezing or shortness of breath related to asthma.   Medical Decision Making Problem Points:  Established problem,  improving (2), Review of last therapy session (1) and Review of psycho-social stressors (1) Data Points:  Order Aims Assessment (2) Review or order clinical lab tests (1) Review of medication regiment & side effects (2) Review of new medications or change in dosage (2)  I certify that inpatient services furnished can reasonably be expected to improve the patient's condition.   Thedore Mins, MD 08/15/2013, 1:18 PM

## 2013-08-15 NOTE — Progress Notes (Signed)
Patient ID: Darren [name], male   DOB: 1975/07/16, 38 y.o.   MRN: [id number] D)  Has been out and about on the hall and dayroom this evening, sometimes looks anxious, but stated was OK.  Attended group, stated he had had a good day, Had gotten to play basketball and stated, "I've still got it!"   His goal is to get better.  Stated he had slept pretty well last night, woke up a time or two, but overall had a good night, wants to be able to get some sleep again tonight and stated he wanted to take his sleep meds.  After group and snack, however, he was offered trazodone for sleep, refused.  Went to his room and got into bed, stated he didn't need anything, closed his eyes. A)  Will continue to monitor for safety, continue POC R)  Safety maintained.

## 2013-08-16 MED ORDER — TRAZODONE HCL 100 MG PO TABS
100.0000 mg | ORAL_TABLET | Freq: Every day | ORAL | Status: DC
  Administered 2013-08-16 – 2013-08-23 (×8): 100 mg via ORAL
  Filled 2013-08-16: qty 1
  Filled 2013-08-16: qty 14
  Filled 2013-08-16 (×8): qty 1

## 2013-08-16 MED ORDER — OLANZAPINE 7.5 MG PO TABS
15.0000 mg | ORAL_TABLET | Freq: Every day | ORAL | Status: DC
  Administered 2013-08-16 – 2013-08-19 (×4): 15 mg via ORAL
  Filled 2013-08-16 (×6): qty 2

## 2013-08-16 NOTE — [facility] Group Notes (Signed)
[facility] LCSW Group Therapy  08/16/2013 1:15 pm  Type of Therapy: Process Group Therapy  Participation Level:  Active  Participation Quality:  Appropriate  Affect:  Flat  Cognitive:  Oriented  Insight:  Improving  Engagement in Group:  Limited  Engagement in Therapy:  Limited  Modes of Intervention:  Activity, Clarification, Education, Problem-solving and Support  Summary of Progress/Problems: Today's group addressed the issue of overcoming obstacles.  Patients were asked to identify their biggest obstacle post d/c that stands in the way of their on-going success, and then problem solve as to how to manage this.  Darren [name] was engaged throughout.  He talked about how a big obstacle for him is his impatience, and how his goal is to practice patience.  He wasn't sure how to do this, but identified his pastor as his main support.  His pastor knows when he is not doing well, and she gives him encouragement and helps him feel hopeful.  Ida Rogueorth, Darren [name] 08/16/2013   3:56 PM
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
## 2013-08-16 NOTE — Progress Notes (Signed)
Adult Psychoeducational Group Note  Date:  08/16/2013 Time:  6:37 PM  Group Topic/Focus:  Wellness Toolbox:   The focus of this group is to discuss various aspects of wellness, balancing those aspects and exploring ways to increase the ability to experience wellness.  Patients will create a wellness toolbox for use upon discharge.  Participation Level:  Active  Participation Quality:  Appropriate  Affect:  Appropriate  Cognitive:  Appropriate  Insight: Good  Engagement in Group:  Engaged  Modes of Intervention:  Education and Support  Additional Comments:  Pt participated in group.  Marquis Lunchbrahim, Jolie Strohecker 08/16/2013, 6:37 PM

## 2013-08-16 NOTE — Progress Notes (Signed)
Pt presents with flat affect and depressed mood. Pt appears worried and also appears to be responding to internal stimuli. Pt reported ongoing auditory and visual hallucinations. Pt reported passive SI and verbally contracts for safety.  Pt has minimal interaction on the unit and appears paranoid.  Medications administered as ordered per MD. Verbal supports given. Pt encouraged to attend groups. 15 minute checks performed for safety.  Pt safety maintained at this time.

## 2013-08-16 NOTE — Progress Notes (Signed)
Adult Psychoeducational Group Note  Date:  08/16/2013 Time:  9:26 PM  Group Topic/Focus:  Wrap-Up Group:   The focus of this group is to help patients review their daily goal of treatment and discuss progress on daily workbooks.  Participation Level:  Minimal  Participation Quality:  Appropriate  Affect:  Flat  Cognitive:  Oriented  Insight: Limited  Engagement in Group:  Limited  Modes of Intervention:  Socialization and Support  Additional Comments:  Patient attended and participated in group tonight. He reports having a good day. He went outside with the group, he read, went for his meals and attended his groups. The patient reports that when he is well, he likes to exercise.  Lita MainsFrancis, Deklyn Trachtenberg [facility] 08/16/2013, 9:26 PM

## 2013-08-16 NOTE — Progress Notes (Signed)
Patient ID: Darren [name], male   DOB: 10-06-1975, 38 y.o.   MRN: [id number] D)   Has been sitting in the dayroom this evening, watching tv with a few male peers, affect is depressed and somewhat suspicious, appeared wide-eyed and anxious.  Requested and was given vistaril for anxiety, which he reported had helped.  Attended group, came back to med window to ask for sleep med,  Remains soft spoken, watchful, went to bed shortly after, endorses voices, occasional passive thoughts of self harm.  Contracts for safety. A)  Will continue to monitor for safety , continue POC R)  Safety maintained.

## 2013-08-16 NOTE — Progress Notes (Signed)
D   Pt appears anxious and worried   He is guarded and does appear to respond to internal stimuli and does endorse voices at times   His interaction is limited and he does little socialization with others    A   Verbal support given  Medications administered and education done and effectiveness monitored   Q 15 min checks R   Pt safe at present

## 2013-08-16 NOTE — Progress Notes (Signed)
[facility] MD Progress Note  08/16/2013 11:13 AM Darren [name]  MRN:  [id number] Subjective:   " I don't think my medicine is working.  I'm feeling more depressed I continued to hear voices.''   Objective:  Patient seen and chart reviewed.  Patient reported increased hallucination, paranoia and depressive thoughts.  He could eat to endorse suicidal thoughts but denies any plan .  He's been very isolated, withdrawn and difficult to engage.  He does not feel Abilify is helping his hallucination and paranoia.  He used to take Zyprexa 50 mg which helped him in the past.  He admitted not taking his medication because he felt he does not need it.  Patient used to see psychiatrist at [facility] .  He admitted poor sleep, racing thoughts, and increase in his depression.  He has no tremors or shakes.    Diagnosis:   DSM5: Total Time spent with patient: 20 minutes  AXIS I: Schizoaffective disorder, bipolar type  AXIS II: Deferred  AXIS III:  Past Medical History   Diagnosis  Date   .  Schizophrenia    .  Bipolar disorder (manic depression)    .  Asthma     AXIS IV: economic problems, occupational problems, other psychosocial or environmental problems and problems with primary support group  AXIS V: 31-40 impairment in reality testing   ADL's:  Intact  Sleep: Good  Appetite:  Fair  Suicidal Ideation:  Passive SI with no plan Homicidal Ideation:  Denies  AEB (as evidenced by):  Psychiatric Specialty Exam: Physical Exam  Review of Systems  Constitutional: Negative.   HENT: Negative.   Eyes: Negative.   Respiratory: Negative.   Cardiovascular: Negative.   Gastrointestinal: Negative.   Genitourinary: Negative.   Musculoskeletal: Negative.   Skin: Negative.   Neurological: Negative.   Endo/Heme/Allergies: Negative.   Psychiatric/Behavioral: Positive for depression, suicidal ideas and hallucinations. The patient is nervous/anxious.     Blood pressure 106/72, pulse 100, temperature 98.2 F  (36.8 C), temperature source Oral, resp. rate 18, height 5\' 5"  (1.651 m), weight 154 lb (69.854 kg).Body mass index is 25.63 kg/(m^2).  General Appearance: Disheveled  Eye Contact::  Minimal  Speech:  Slow  Volume:  Decreased  Mood:  Dysphoric  Affect:  Constricted  Thought Process:  Circumstantial and Disorganized  Orientation:  Full (Time, Place, and Person)  Thought Content:  Delusions and Hallucinations: Auditory Visual  Suicidal Thoughts:  Yes.  without intent/plan  Homicidal Thoughts:  No  Memory:  Immediate;   Fair Recent;   Fair Remote;   Fair  Judgement:  Impaired  Insight:  Shallow  Psychomotor Activity:  Decreased  Concentration:  Fair  Recall:  [employer] of Knowledge:Fair  Language: Good  Akathisia:  No  Handed:  Right  AIMS (if indicated):     Assets:  Communication Skills Desire for Improvement Physical Health  Sleep:  Number of Hours: 6.5   Musculoskeletal: Strength & Muscle Tone: within normal limits Gait & Station: normal Patient leans: N/A  Current Medications: Current Facility-Administered Medications  Medication Dose Route Frequency Provider Last Rate Last Dose  . acetaminophen (TYLENOL) tablet 650 mg  650 mg Oral Q6H PRN Kerry Hough, PA-C      . albuterol (PROVENTIL HFA;VENTOLIN HFA) 108 (90 BASE) MCG/ACT inhaler 2 puff  2 puff Inhalation Q6H PRN Kerry Hough, PA-C   2 puff at 08/15/13 2130  . alum & mag hydroxide-simeth (MAALOX/MYLANTA) 200-200-20 MG/5ML suspension 30 mL  30 mL  Oral Q4H PRN Benjaman PottGerald D Taylor, MD      . ARIPiprazole (ABILIFY) tablet 10 mg  10 mg Oral BID PC Mojeed Akintayo   10 mg at 08/16/13 0807  . benztropine (COGENTIN) tablet 0.5 mg  0.5 mg Oral BID Mojeed Akintayo   0.5 mg at 08/16/13 0807  . divalproex (DEPAKOTE) DR tablet 250 mg  250 mg Oral BID PC Mojeed Akintayo   250 mg at 08/16/13 0807  . feeding supplement (ENSURE COMPLETE) (ENSURE COMPLETE) liquid 237 mL  237 mL Oral BID BM Tenny CrawHeather S Winkler, RD   237 mL at  08/15/13 1955  . hydrOXYzine (ATARAX/VISTARIL) tablet 25 mg  25 mg Oral Q6H PRN Kerry HoughSpencer E Simon, PA-C   25 mg at 08/15/13 1956  . magnesium hydroxide (MILK OF MAGNESIA) suspension 30 mL  30 mL Oral Daily PRN Kerry HoughSpencer E Simon, PA-C      . nicotine (NICODERM CQ - dosed in mg/24 hours) patch 21 mg  21 mg Transdermal Daily Benjaman PottGerald D Taylor, MD      . OLANZapine zydis (ZYPREXA) disintegrating tablet 10 mg  10 mg Oral Q8H PRN Mojeed Akintayo   10 mg at 08/15/13 2131  . ondansetron (ZOFRAN) tablet 4 mg  4 mg Oral Q8H PRN Benjaman PottGerald D Taylor, MD   4 mg at 08/15/13 1643  . traZODone (DESYREL) tablet 50 mg  50 mg Oral QHS Kerry HoughSpencer E Simon, PA-C   50 mg at 08/15/13 2129    Lab Results: No results found for this or any previous visit (from the past 48 hour(s)).  Physical Findings: AIMS: Facial and Oral Movements Muscles of Facial Expression: None, normal Lips and Perioral Area: None, normal Jaw: None, normal Tongue: None, normal,Extremity Movements Upper (arms, wrists, hands, fingers): None, normal Lower (legs, knees, ankles, toes): None, normal, Trunk Movements Neck, shoulders, hips: None, normal, Overall Severity Severity of abnormal movements (highest score from questions above): None, normal Incapacitation due to abnormal movements: None, normal Patient's awareness of abnormal movements (rate only patient's report): No Awareness,    CIWA:    COWS:     Treatment Plan Summary: Daily contact with patient to assess and evaluate symptoms and progress in treatment Medication management  Plan:  1. Continue crisis management and stabilization.  2. Medication management:  -Discontinue Abilify and start Zyprexa 15 mg at bedtime for psychosis and depression.   -Continue Cogentin 0.5 mg BID for EPS prevention -Increase Trazodone 100 mg hs for insomnia.  3. Encouraged patient to attend groups and participate in group counseling sessions and activities.  4. Discharge plan in progress.  5. Continue current  treatment plan.  6. Address health issues: Continue albuterol two puffs every six hours prn wheezing or shortness of breath related to asthma.   Medical Decision Making Problem Points:  Established problem, improving (2), Review of last therapy session (1) and Review of psycho-social stressors (1) Data Points:  Order Aims Assessment (2) Review or order clinical lab tests (1) Review of medication regiment & side effects (2) Review of new medications or change in dosage (2)  I certify that inpatient services furnished can reasonably be expected to improve the patient's condition.   Areesha Dehaven T., MD 08/16/2013, 11:13 AM

## 2013-08-17 NOTE — Progress Notes (Signed)
Pt presents with flat affect and depressed mood. Pt rates depression and hopeless 9/10. Pt continues to endorse auditory hallucinations, telling him to hurt himself. Pt reported passive SI this morning. Pt verbally contracts for safety. Pt appears paranoid. Pt has minimal interaction on the milieu. Pt appears disheveled, poor hygiene. Medications administered as ordered per MD. Verbal support given. Pt encouraged to attend groups. 15 minute checks performed for safety. Pt safety maintained at this time.

## 2013-08-17 NOTE — Progress Notes (Signed)
Patient ID: Darren [name], male   DOB: 02/16/1975, 38 y.o.   MRN: [id number] [facility]  08/17/2013 11:58 AM Darren [name]  MRN:  [id number] Subjective:   Pt seen and chart reviewed. Pt denies SI, HI, and AVH, contracts for safety. Pt reports that his voices are still occurring and that they are talking but he cannot tell what they are saying. Pt also reports eeing little people walking around here and there but that they just walk by without interaction with him. Pt reports good sleep and appetite and denies other complaints at this time. Pt reports that he is very concerned about this children whom he knows both carry guns at the age of 37.     Diagnosis:   DSM5: Total Time spent with patient: 25 minutes    AXIS I: Schizoaffective disorder, bipolar type  AXIS II: Deferred  AXIS III:  Past Medical History   Diagnosis  Date   .  Schizophrenia    .  Bipolar disorder (manic depression)    .  Asthma     AXIS IV: economic problems, occupational problems, other psychosocial or environmental problems and problems with primary support group  AXIS V: 31-40 impairment in reality testing   ADL's:  Intact  Sleep: Good  Appetite:  Fair  Suicidal Ideation:  Passive SI with no plan Homicidal Ideation:  Denies  AEB (as evidenced by):  Psychiatric Specialty Exam: Physical Exam  Review of Systems  Constitutional: Negative.   HENT: Negative.   Eyes: Negative.   Respiratory: Negative.   Cardiovascular: Negative.   Gastrointestinal: Negative.   Genitourinary: Negative.   Musculoskeletal: Negative.   Skin: Negative.   Neurological: Negative.   Endo/Heme/Allergies: Negative.   Psychiatric/Behavioral: Positive for depression, suicidal ideas and hallucinations. The patient is nervous/anxious.     Blood pressure 130/57, pulse 105, temperature 97.8 F (36.6 C), temperature source Oral, resp. rate 20, height 5\' 5"  (1.651 m), weight 69.854 kg (154 lb).Body mass index is 25.63  kg/(m^2).  General Appearance: Disheveled  Eye Contact::  Minimal  Speech:  Slow  Volume:  Decreased  Mood:  Dysphoric  Affect:  Constricted  Thought Process:  Circumstantial and Disorganized  Orientation:  Full (Time, Place, and Person)  Thought Content:  Delusions and Hallucinations: Auditory Visual  Suicidal Thoughts:  Denies  Homicidal Thoughts:  No  Memory:  Immediate;   Fair Recent;   Fair Remote;   Fair  Judgement:  Impaired  Insight:  Shallow  Psychomotor Activity:  Decreased  Concentration:  Fair  Recall:  [employer]  Fund of Knowledge:Fair  Language: Good  Akathisia:  No  Handed:  Right  AIMS (if indicated):     Assets:  Communication Skills Desire for Improvement Physical Health  Sleep:  Number of Hours: 5.75   Musculoskeletal: Strength & Muscle Tone: within normal limits Gait & Station: normal Patient leans: N/A  Current Medications: Current Facility-Administered Medications  Medication Dose Route Frequency Provider Last Rate Last Dose  . acetaminophen (TYLENOL) tablet 650 mg  650 mg Oral Q6H PRN Kerry HoughSpencer E Simon, PA-C      . albuterol (PROVENTIL HFA;VENTOLIN HFA) 108 (90 BASE) MCG/ACT inhaler 2 puff  2 puff Inhalation Q6H PRN Kerry HoughSpencer E Simon, PA-C   2 puff at 08/16/13 1659  . alum & mag hydroxide-simeth (MAALOX/MYLANTA) 200-200-20 MG/5ML suspension 30 mL  30 mL Oral Q4H PRN Benjaman PottGerald D Taylor, MD      . benztropine (COGENTIN) tablet 0.5 mg  0.5 mg Oral BID  Mojeed Akintayo   0.5 mg at 08/17/13 0723  . divalproex (DEPAKOTE) DR tablet 250 mg  250 mg Oral BID PC Mojeed Akintayo   250 mg at 08/17/13 0723  . feeding supplement (ENSURE COMPLETE) (ENSURE COMPLETE) liquid 237 mL  237 mL Oral BID BM Tenny Craw, RD   237 mL at 08/16/13 2131  . hydrOXYzine (ATARAX/VISTARIL) tablet 25 mg  25 mg Oral Q6H PRN Kerry Hough, PA-C   25 mg at 08/15/13 1956  . magnesium hydroxide (MILK OF MAGNESIA) suspension 30 mL  30 mL Oral Daily PRN Kerry Hough, PA-C      . nicotine  (NICODERM CQ - dosed in mg/24 hours) patch 21 mg  21 mg Transdermal Daily Benjaman Pott, MD      . OLANZapine [facility]) tablet 15 mg  15 mg Oral QHS Cleotis Nipper, MD   15 mg at 08/16/13 2131  . OLANZapine zydis (ZYPREXA) disintegrating tablet 10 mg  10 mg Oral Q8H PRN Mojeed Akintayo   10 mg at 08/15/13 2131  . ondansetron (ZOFRAN) tablet 4 mg  4 mg Oral Q8H PRN Benjaman Pott, MD   4 mg at 08/15/13 1643  . traZODone (DESYREL) tablet 100 mg  100 mg Oral QHS Cleotis Nipper, MD   100 mg at 08/16/13 2131    Lab Results: No results found for this or any previous visit (from the past 48 hour(s)).  Physical Findings: AIMS: Facial and Oral Movements Muscles of Facial Expression: None, normal Lips and Perioral Area: None, normal Jaw: None, normal Tongue: None, normal,Extremity Movements Upper (arms, wrists, hands, fingers): None, normal Lower (legs, knees, ankles, toes): None, normal, Trunk Movements Neck, shoulders, hips: None, normal, Overall Severity Severity of abnormal movements (highest score from questions above): None, normal Incapacitation due to abnormal movements: None, normal Patient's awareness of abnormal movements (rate only patient's report): No Awareness,    CIWA:    COWS:     Treatment Plan Summary: Daily contact with patient to assess and evaluate symptoms and progress in treatment Medication management  Plan:  1. Continue crisis management and stabilization.  2. Medication management:  -Discontinue Abilify and start Zyprexa 15 mg at bedtime for psychosis and depression.   -Continue Cogentin 0.5 mg BID for EPS prevention -Increase Trazodone 100 mg hs for insomnia.  3. Encouraged patient to attend groups and participate in group counseling sessions and activities.  4. Discharge plan in progress.  5. Continue current treatment plan.  6. Address health issues: Continue albuterol two puffs every six hours prn wheezing or shortness of breath related to asthma.    Medical Decision Making Problem Points:  Established problem, improving (2), Review of last therapy session (1) and Review of psycho-social stressors (1) Data Points:  Order Aims Assessment (2) Review or order clinical lab tests (1) Review of medication regiment & side effects (2) Review of new medications or change in dosage (2)  I certify that inpatient services furnished can reasonably be expected to improve the patient's condition.   Hanaa Payes C,FNP-BC 08/17/2013, 11:58 AM

## 2013-08-17 NOTE — [facility] Group Notes (Signed)
[facility] LCSW Group Therapy  08/17/2013 1:45 PM   Type of Therapy:  Group Therapy  Participation Level:  Active  Participation Quality:  Attentive  Affect:  Appropriate  Cognitive:  Appropriate  Insight:  Improving  Engagement in Therapy:  Engaged  Modes of Intervention:  Clarification, Education, Exploration and Socialization  Summary of Progress/Problems: Today's group focused on relapse prevention.  We defined the term, and then brainstormed on ways to prevent relapse.  Darren [name] was quiet but attentive throughout.  He talked about side effects to medications at one point, and suggested a way of dealing with it is making sure to tell your Dr what side effects one is experiencing.  Darren [name], Darren [name] 08/17/2013 , 1:45 PM

## 2013-08-17 NOTE — Tx Team (Signed)
  Interdisciplinary Treatment Plan Update   Date Reviewed:  08/17/2013  Time Reviewed:  4:18 PM  Progress in Treatment:   Attending groups: Yes Participating in groups: Yes Taking medication as prescribed: Yes  Tolerating medication: Yes Family/Significant other contact made: Yes  Patient understands diagnosis: Yes  Discussing patient identified problems/goals with staff: Yes Medical problems stabilized or resolved: Yes Denies suicidal/homicidal ideation: Yes Patient has not harmed self or others: Yes  For review of initial/current patient goals, please see plan of care.  Estimated Length of Stay:  4-5 days  Reason for Continuation of Hospitalization: Depression, SI, Hallucinations, Paranoia, Medication Management  New Problems/Goals identified:  N/A  Discharge Plan or Barriers:   return home, follow up outpt  Additional Comments:  " I don't think my medicine is working. I'm feeling more depressed I continued to hear voices.''   Patient reported increased hallucination, paranoia and depressive thoughts. He continues to endorse suicidal thoughts but denies any plan . He's been very isolated, withdrawn and difficult to engage. He does not feel Abilify is helping his hallucinations and paranoia. He used to take Zyprexa 50 mg which helped him in the past. He admitted not taking his medication because he felt he does not need it. [facility] . He admitted poor sleep, racing thoughts, and increase in his depression. He has no tremors or shakes. Medication changed to Zyprexa yesterday.   Attendees:  Signature: Thedore MinsMojeed Akintayo, MD 08/17/2013 4:18 PM   Signature: Richelle Itood Kissy Cielo, LCSW 08/17/2013 4:18 PM  Signature: Fransisca KaufmannLaura Davis, NP 08/17/2013 4:18 PM  Signature: Joslyn Devonaroline Beaudry, RN 08/17/2013 4:18 PM  Signature: Liborio NixonPatrice White, RN 08/17/2013 4:18 PM  Signature:  08/17/2013 4:18 PM  Signature:   08/17/2013 4:18 PM  Signature:    Signature:    Signature:    Signature:     Signature:    Signature:      Scribe for Treatment Team:   Richelle Itood Catalea Labrecque, LCSW  08/17/2013 4:18 PM

## 2013-08-17 NOTE — [facility] Group Notes (Signed)
Adult Psychoeducational Group Note  Date:  08/17/2013 Time:  9:31 PM  Group Topic/Focus:  Wrap-Up Group:   The focus of this group is to help patients review their daily goal of treatment and discuss progress on daily workbooks.  Participation Level:  Minimal  Participation Quality:  Attentive  Affect:  Flat  Cognitive:  Appropriate  Insight: Good  Engagement in Group:  Limited  Modes of Intervention:  Discussion  Additional Comments:  Darren [name] stated he enjoyed his day.  He expressed that he has [name] lot of worries and missing his children, whom he hasn't seen in two years.  Darren [name], Darren [name] 08/17/2013, 9:31 PM

## 2013-08-18 MED ORDER — CITALOPRAM HYDROBROMIDE 10 MG PO TABS
10.0000 mg | ORAL_TABLET | Freq: Every day | ORAL | Status: DC
  Administered 2013-08-18 – 2013-08-24 (×7): 10 mg via ORAL
  Filled 2013-08-18 (×11): qty 1
  Filled 2013-08-18: qty 14

## 2013-08-18 NOTE — Progress Notes (Signed)
Patient ID: Darren [name], male   DOB: [birth year], 38 y.o.   MRN: [id number] [facility]  08/18/2013 5:20 PM Darren [name]  MRN:  [id number] Subjective:   Pt seen and chart reviewed. Pt denies SI, HI, and AVH, contracts for safety. Pt reports that his medication regimen is working very well and that he is hearing voices and seeing hallucinations much less than yesterday. Pt feels he is making excellent progress and wants to continue his current regimen to determine whether or not the AVH will continue to fade away.     Diagnosis:   DSM5: Total Time spent with patient: 25 minutes    AXIS I: Schizoaffective disorder, bipolar type  AXIS II: Deferred  AXIS III:  Past Medical History   Diagnosis  Date   .  Schizophrenia    .  Bipolar disorder (manic depression)    .  Asthma     AXIS IV: economic problems, occupational problems, other psychosocial or environmental problems and problems with primary support group  AXIS V: 31-40 impairment in reality testing   ADL's:  Intact  Sleep: Good  Appetite:  Fair  Suicidal Ideation:  Passive SI with no plan Homicidal Ideation:  Denies  AEB (as evidenced by):  Psychiatric Specialty Exam: Physical Exam  Review of Systems  Constitutional: Negative.   HENT: Negative.   Eyes: Negative.   Respiratory: Negative.   Cardiovascular: Negative.   Gastrointestinal: Negative.   Genitourinary: Negative.   Musculoskeletal: Negative.   Skin: Negative.   Neurological: Negative.   Endo/Heme/Allergies: Negative.   Psychiatric/Behavioral: Positive for depression, suicidal ideas and hallucinations. The patient is nervous/anxious.     Blood pressure 139/70, pulse 96, temperature 98.6 F (37 C), temperature source Oral, resp. rate 18, height 5\' 5"  (1.651 m), weight 69.854 kg (154 lb).Body mass index is 25.63 kg/(m^2).  General Appearance: Disheveled  Eye Contact::  Minimal  Speech:  Slow  Volume:  Decreased  Mood:  Dysphoric  Affect:   Constricted  Thought Process:  Circumstantial and Disorganized  Orientation:  Full (Time, Place, and Person)  Thought Content:  Delusions and Hallucinations: Auditory Visual  Suicidal Thoughts:  Denies  Homicidal Thoughts:  No  Memory:  Immediate;   Fair Recent;   Fair Remote;   Fair  Judgement:  Impaired  Insight:  Shallow  Psychomotor Activity:  Decreased  Concentration:  Fair  Recall:  [employer]Fair  Fund of Knowledge:Fair  Language: Good  Akathisia:  No  Handed:  Right  AIMS (if indicated):     Assets:  Communication Skills Desire for Improvement Physical Health  Sleep:  Number of Hours: 6.75   Musculoskeletal: Strength & Muscle Tone: within normal limits Gait & Station: normal Patient leans: N/A  Current Medications: Current Facility-Administered Medications  Medication Dose Route Frequency Provider Last Rate Last Dose  . acetaminophen (TYLENOL) tablet 650 mg  650 mg Oral Q6H PRN Kerry HoughSpencer E Simon, PA-C      . albuterol (PROVENTIL HFA;VENTOLIN HFA) 108 (90 BASE) MCG/ACT inhaler 2 puff  2 puff Inhalation Q6H PRN Kerry HoughSpencer E Simon, PA-C   2 puff at 08/17/13 1630  . alum & mag hydroxide-simeth (MAALOX/MYLANTA) 200-200-20 MG/5ML suspension 30 mL  30 mL Oral Q4H PRN Benjaman PottGerald D Taylor, MD      . benztropine (COGENTIN) tablet 0.5 mg  0.5 mg Oral BID Mojeed Akintayo   0.5 mg at 08/18/13 1651  . divalproex (DEPAKOTE) DR tablet 250 mg  250 mg Oral BID PC Mojeed Akintayo  250 mg at 08/18/13 1651  . feeding supplement (ENSURE COMPLETE) (ENSURE COMPLETE) liquid 237 mL  237 mL Oral BID BM Tenny Craw, RD   237 mL at 08/18/13 1515  . hydrOXYzine (ATARAX/VISTARIL) tablet 25 mg  25 mg Oral Q6H PRN Kerry Hough, PA-C   25 mg at 08/18/13 0740  . magnesium hydroxide (MILK OF MAGNESIA) suspension 30 mL  30 mL Oral Daily PRN Kerry Hough, PA-C      . nicotine (NICODERM CQ - dosed in mg/24 hours) patch 21 mg  21 mg Transdermal Daily Benjaman Pott, MD      . OLANZapine [facility]) tablet 15 mg   15 mg Oral QHS Cleotis Nipper, MD   15 mg at 08/17/13 2148  . OLANZapine zydis (ZYPREXA) disintegrating tablet 10 mg  10 mg Oral Q8H PRN Mojeed Akintayo   10 mg at 08/17/13 1502  . ondansetron (ZOFRAN) tablet 4 mg  4 mg Oral Q8H PRN Benjaman Pott, MD   4 mg at 08/15/13 1643  . traZODone (DESYREL) tablet 100 mg  100 mg Oral QHS Cleotis Nipper, MD   100 mg at 08/17/13 2148    Lab Results: No results found for this or any previous visit (from the past 48 hour(s)).  Physical Findings: AIMS: Facial and Oral Movements Muscles of Facial Expression: None, normal Lips and Perioral Area: None, normal Jaw: None, normal Tongue: None, normal,Extremity Movements Upper (arms, wrists, hands, fingers): None, normal Lower (legs, knees, ankles, toes): None, normal, Trunk Movements Neck, shoulders, hips: None, normal, Overall Severity Severity of abnormal movements (highest score from questions above): None, normal Incapacitation due to abnormal movements: None, normal Patient's awareness of abnormal movements (rate only patient's report): No Awareness,    CIWA:    COWS:     Treatment Plan Summary: Daily contact with patient to assess and evaluate symptoms and progress in treatment Medication management  Plan:  1. Continue crisis management and stabilization.  2. Medication management:  -Discontinue Abilify and start Zyprexa 15 mg at bedtime for psychosis and depression.   -Continue Cogentin 0.5 mg BID for EPS prevention -Increase Trazodone 100 mg hs for insomnia.  3. Encouraged patient to attend groups and participate in group counseling sessions and activities.  4. Discharge plan in progress.  5. Continue current treatment plan.  6. Address health issues: Continue albuterol two puffs every six hours prn wheezing or shortness of breath related to asthma.   Medical Decision Making Problem Points:  Established problem, improving (2), Review of last therapy session (1) and Review of psycho-social  stressors (1) Data Points:  Order Aims Assessment (2) Review or order clinical lab tests (1) Review of medication regiment & side effects (2) Review of new medications or change in dosage (2)  I certify that inpatient services furnished can reasonably be expected to improve the patient's condition.   Laneta Guerin C,FNP-BC 08/18/2013, 5:20 PM

## 2013-08-18 NOTE — [facility] Group Notes (Signed)
[facility]   08/18/2013 11:42 AM  Participation Quality:  Engaged  Mood/Affect:  Depressed  Depression Rating:    Anxiety Rating:    Thoughts of Suicide:  No Will you contract for safety?   NA  Current AVH:  Yes  Plan for Discharge/Comments:  Looks depressed.  Confirms he is depressed.  Says it is because of his children and his concerns about them.  OK with meds.  Says pastor did not visit.  Agrees that we can call her today.  Transportation Means: bus  Supports: pastor  Darren [name], Darren [name]

## 2013-08-18 NOTE — [facility] Group Notes (Signed)
[facility]  08/18/2013 , 1:38 PM    Type of Therapy:  [employer] Presentation  Participation Level:  Active  Participation Quality:  Attentive  Affect:  Blunted  Cognitive:  Oriented  Insight:  Limited  Engagement in Therapy:  Engaged  Modes of Intervention:  Discussion, Education and Socialization  Summary of Progress/Problems:  Onalee HuaDavid from [employer] came to present his recovery story and play the guitar.  Sat quietly throughout.  [name], Kenidee Cregan B 08/18/2013 , 1:38 PM

## 2013-08-18 NOTE — Progress Notes (Signed)
D   Pt appears fearful and sad   He denies suicidal ideation and said he does not want to hurt himself   He continues to endorse voices and has minimal interactions with others    A   Verbal support given   Medications administered and effectiveness monitored   Q 15 min checks R   Pt safe at present

## 2013-08-18 NOTE — Progress Notes (Signed)
Patient ID: Darren [name], male   DOB: 10/07/1975, 38 y.o.   MRN: [id number] D: Patient quite sitting in dayroom. Pt reports his day was good but would like something for sleep. Pt reports not sure what he will do after discharge because he is unable to see kids. Pt presented with depressed mood and flat affect.  Pt denies SI/HI/AVH and pain.  Cooperative with assessment. No acute distressed noted at this time.   A: Met with pt 1:1. Medications administered as prescribed. Writer encouraged pt to discuss feelings. Pt encouraged to come to staff with any question or concerns.   R: Patient remains safe. He is complaint with medications and denies any adverse reaction. Continue current POC.

## 2013-08-18 NOTE — Progress Notes (Signed)
Pt presents with flat affect and depressed mood. Pt reports feeling depressed today and requested an antidepressant. PA made aware of pt request. Pt has minimal interaction on the unit. Pt appears worried and withdrawn. Pt reports auditory hallucinations telling him to hurt himself. Pt denies SI. Pt agrees to report worsening symptoms and contracts for safety. Medications administered as ordered per MD. Verbal support given. Pt encouraged to attend groups. 15 minute checks performed for safety. Pt safety maintained.

## 2013-08-18 NOTE — Progress Notes (Signed)
Adult Psychoeducational Group Note  Date:  08/18/2013 Time:  8:43 PM  Group Topic/Focus:  Wrap-Up Group:   The focus of this group is to help patients review their daily goal of treatment and discuss progress on daily workbooks.  Participation Level:  Active  Participation Quality:  Appropriate  Affect:  Appropriate  Cognitive:  Appropriate  Insight: Appropriate  Engagement in Group:  Engaged  Modes of Intervention:  Discussion  Additional Comments: The patient expressed that he had a good day today.The patient said that going outside and playing basketball made his day.  Octavio Mannshigpen, Haylo Fake Lee 08/18/2013, 8:43 PM

## 2013-08-19 MED ORDER — RISPERIDONE MICROSPHERES 25 MG IM SUSR
25.0000 mg | INTRAMUSCULAR | Status: DC
  Administered 2013-08-19: 25 mg via INTRAMUSCULAR
  Filled 2013-08-19: qty 2

## 2013-08-19 NOTE — Progress Notes (Signed)
[facility] MD Progress Note  08/19/2013 8:45 AM Darren [name]  MRN:  [id number] Subjective:  Patient agreed to an injectable antipsychotic medication after discussing his stability when he takes his medications with his wife yesterday.  Sleep fair, appetite is improving, denies hallucinations, "little bit" depressed.  Forwards little information but answers questions appropriately. Diagnosis:   DSM5:  Total Time spent with patient: 20 minutes  Axis I: Schizoaffective Disorder Axis II: Deferred Axis III:  Past Medical History  Diagnosis Date  . Schizophrenia   . Bipolar disorder (manic depression)   . Asthma    Axis IV: other psychosocial or environmental problems, problems related to social environment and problems with primary support group Axis V: 41-50 serious symptoms  ADL's:  Intact  Sleep: Fair  Appetite:  Fair  Suicidal Ideation:  Denies  Homicidal Ideation:  Denies   Psychiatric Specialty Exam: Physical Exam  Constitutional: He is oriented to person, place, and time. He appears well-developed and well-nourished.  HENT:  Head: Normocephalic and atraumatic.  Neck: Normal range of motion.  Respiratory: Effort normal.  Musculoskeletal: Normal range of motion.  Neurological: He is alert and oriented to person, place, and time.    Review of Systems  Constitutional: Negative.   HENT: Negative.   Eyes: Negative.   Respiratory: Negative.   Cardiovascular: Negative.   Gastrointestinal: Negative.   Genitourinary: Negative.   Musculoskeletal: Negative.   Skin: Negative.   Neurological: Negative.   Endo/Heme/Allergies: Negative.     Blood pressure 107/71, pulse 100, temperature 98.2 F (36.8 C), temperature source Oral, resp. rate 20, height 5\' 5"  (1.651 m), weight 69.854 kg (154 lb).Body mass index is 25.63 kg/(m^2).  General Appearance: Casual  Eye Contact::  Good  Speech:  Normal Rate  Volume:  Decreased  Mood:  Depressed  Affect:  Blunt  Thought Process:   Coherent  Orientation:  Full (Time, Place, and Person)  Thought Content:  WDL  Suicidal Thoughts:  No  Homicidal Thoughts:  No  Memory:  Immediate;   Fair Recent;   Fair Remote;   Fair  Judgement:  Fair  Insight:  Fair  Psychomotor Activity:  Normal  Concentration:  Fair  Recall:  [employer] of Knowledge:Good  Language: Fair  Akathisia:  No  Handed:  Right  AIMS (if indicated):     Assets:  Housing Leisure Time Physical Health Resilience Social Support  Sleep:  Number of Hours: 6   Musculoskeletal: Strength & Muscle Tone: within normal limits Gait & Station: normal Patient leans: N/A  Current Medications: Current Facility-Administered Medications  Medication Dose Route Frequency Provider Last Rate Last Dose  . acetaminophen (TYLENOL) tablet 650 mg  650 mg Oral Q6H PRN Kerry Hough, PA-C      . albuterol (PROVENTIL HFA;VENTOLIN HFA) 108 (90 BASE) MCG/ACT inhaler 2 puff  2 puff Inhalation Q6H PRN Kerry Hough, PA-C   2 puff at 08/18/13 2057  . alum & mag hydroxide-simeth (MAALOX/MYLANTA) 200-200-20 MG/5ML suspension 30 mL  30 mL Oral Q4H PRN Benjaman Pott, MD      . benztropine (COGENTIN) tablet 0.5 mg  0.5 mg Oral BID Mojeed Akintayo   0.5 mg at 08/19/13 0824  . citalopram (CELEXA) tablet 10 mg  10 mg Oral Daily Beau Fanny, FNP   10 mg at 08/19/13 4098  . divalproex (DEPAKOTE) DR tablet 250 mg  250 mg Oral BID PC Mojeed Akintayo   250 mg at 08/19/13 0824  . feeding supplement (ENSURE COMPLETE) (  ENSURE COMPLETE) liquid 237 mL  237 mL Oral BID BM Tenny CrawHeather S Winkler, RD   237 mL at 08/18/13 2042  . hydrOXYzine (ATARAX/VISTARIL) tablet 25 mg  25 mg Oral Q6H PRN Kerry HoughSpencer E Simon, PA-C   25 mg at 08/18/13 0740  . magnesium hydroxide (MILK OF MAGNESIA) suspension 30 mL  30 mL Oral Daily PRN Kerry HoughSpencer E Simon, PA-C      . nicotine (NICODERM CQ - dosed in mg/24 hours) patch 21 mg  21 mg Transdermal Daily Benjaman PottGerald D Taylor, MD      . OLANZapine [facility](ZYPREXA) tablet 15 mg  15 mg Oral  QHS Cleotis NipperSyed T Arfeen, MD   15 mg at 08/18/13 2104  . OLANZapine zydis (ZYPREXA) disintegrating tablet 10 mg  10 mg Oral Q8H PRN Mojeed Akintayo   10 mg at 08/17/13 1502  . ondansetron (ZOFRAN) tablet 4 mg  4 mg Oral Q8H PRN Benjaman PottGerald D Taylor, MD   4 mg at 08/15/13 1643  . traZODone (DESYREL) tablet 100 mg  100 mg Oral QHS Cleotis NipperSyed T Arfeen, MD   100 mg at 08/18/13 2104    Lab Results: No results found for this or any previous visit (from the past 48 hour(s)).  Physical Findings: AIMS: Facial and Oral Movements Muscles of Facial Expression: None, normal Lips and Perioral Area: None, normal Jaw: None, normal Tongue: None, normal,Extremity Movements Upper (arms, wrists, hands, fingers): None, normal Lower (legs, knees, ankles, toes): None, normal, Trunk Movements Neck, shoulders, hips: None, normal, Overall Severity Severity of abnormal movements (highest score from questions above): None, normal Incapacitation due to abnormal movements: None, normal Patient's awareness of abnormal movements (rate only patient's report): No Awareness,    CIWA:    COWS:     Treatment Plan Summary: Daily contact with patient to assess and evaluate symptoms and progress in treatment Medication management  Plan:  Review of chart, vital signs, medications, and notes. 1-Admit for crisis management and stabilization.  Estimated length of stay 1-3 days past his current stay of 8 2-Individual and group therapy encouraged 3-Medication management for psychosis to reduce current symptoms to base line and improve the patient's overall level of functioning:  Medications reviewed with the patient and he stated no untoward effects, Risperdal constance 25 mg IM started today to transition to injectionables versus oral medications 4-Coping skills for psychosis developing-- 5-Continue crisis stabilization and management 6-Address health issues--monitoring vital signs, stable 7-Treatment plan in progress to prevent relapse of  psychosis 8-Psychosocial education regarding relapse prevention and self-care 9-Health care follow up as needed for any health concerns 10-Call for consult with hospitalist for additional specialty patient services as needed.  Medical Decision Making Problem Points:  Established problem, stable/improving (1) and Review of psycho-social stressors (1) Data Points:  Review of medication regiment & side effects (2) Review of new medications or change in dosage (2)  I certify that inpatient services furnished can reasonably be expected to improve the patient's condition.   Nanine MeansLORD, Brittley Regner, PMH-NP 08/19/2013, 8:45 AM

## 2013-08-19 NOTE — Clinical Social Work Note (Signed)
Spoke with SO with [name].  He main concern is his unwillingness to take meds on an on-going basis.  I suggested injection.  Both he and she agreed.

## 2013-08-19 NOTE — Progress Notes (Signed)
Adult Psychoeducational Group Note  Date:  08/19/2013 Time:  10:33 PM  Group Topic/Focus:  Wrap-Up Group:   The focus of this group is to help patients review their daily goal of treatment and discuss progress on daily workbooks.  Participation Level:  Active  Participation Quality:  Appropriate  Affect:  Appropriate  Cognitive:  Appropriate  Insight: Appropriate  Engagement in Group:  Engaged  Modes of Intervention:  Discussion  Additional Comments:    Bradin Mcadory A 08/19/2013, 10:33 PM

## 2013-08-19 NOTE — Tx Team (Signed)
  Interdisciplinary Treatment Plan Update   Date Reviewed:  08/19/2013  Time Reviewed:  9:21 AM  Progress in Treatment:   Attending groups: Yes Participating in groups: Yes Taking medication as prescribed: Yes  Tolerating medication: Yes Family/Significant other contact made: Yes  Patient understands diagnosis: Yes  Discussing patient identified problems/goals with staff: Yes Medical problems stabilized or resolved: Yes Denies suicidal/homicidal ideation: Yes Patient has not harmed self or others: Yes  For review of initial/current patient goals, please see plan of care.  Estimated Length of Stay:  Likely d/c tomorrow  Reason for Continuation of Hospitalization:   New Problems/Goals identified:  N/A  Discharge Plan or Barriers:   return home, follow up outpt  Additional Comments:  Attendees:  Signature: Thedore MinsMojeed Akintayo, MD 08/19/2013 9:21 AM   Signature: Richelle Itood Federico Maiorino, LCSW 08/19/2013 9:21 AM  Signature: Fransisca KaufmannLaura Davis, NP 08/19/2013 9:21 AM  Signature: Joslyn Devonaroline Beaudry, RN 08/19/2013 9:21 AM  Signature: Liborio NixonPatrice White, RN 08/19/2013 9:21 AM  Signature:  08/19/2013 9:21 AM  Signature:   08/19/2013 9:21 AM  Signature:    Signature:    Signature:    Signature:    Signature:    Signature:      Scribe for Treatment Team:   Richelle Itood Karole Oo, LCSW  08/19/2013 9:21 AM

## 2013-08-19 NOTE — [facility] Suicide Risk Assessment (Signed)
[facility] INPATIENT:  Family/Significant Other Suicide Prevention Education  Suicide Prevention Education:  Education Completed; [name], friend, [phone] has been identified by the patient as the family member/significant other with whom the patient will be residing, and identified as the person(s) who will aid the patient in the event of a mental health crisis (suicidal ideations/suicide attempt).  With written consent from the patient, the family member/significant other has been provided the following suicide prevention education, prior to the and/or following the discharge of the patient.  The suicide prevention education provided includes the following:  Suicide risk factors  Suicide prevention and interventions  National Suicide Hotline telephone number  [facility] assessment telephone number  [facility] Emergency Assistance 911  [facility] and/or Residential Mobile Crisis Unit telephone number  Request made of family/significant other to:  Remove weapons (e.g., guns, rifles, knives), all items previously/currently identified as safety concern.    Remove drugs/medications (over-the-counter, prescriptions, illicit drugs), all items previously/currently identified as a safety concern.  The family member/significant other verbalizes understanding of the suicide prevention education information provided.  The family member/significant other agrees to remove the items of safety concern listed above.  [name], Shanai Lartigue B 08/19/2013, 8:18 AM

## 2013-08-19 NOTE — Progress Notes (Signed)
Patient ID: Darren [name], male   DOB: 20-Jan-1975, 38 y.o.   MRN: [id number] D: Patient stated he is tolerating medication well but prefare injections better. Pt has been isolative most of the evening. Pt interaction with peers is minimal. Pt presented with depressed mood and flat affect.  Pt denies SI/HI/AVH and pain.  Cooperative with assessment. No acute distressed noted at this time.   A: Met with pt 1:1. Medications administered as prescribed. Writer encouraged pt to discuss feelings. Pt encouraged to come to staff with any question or concerns.   R: Patient remains safe. He is complaint with medications and denies any adverse reaction. Continue current POC.

## 2013-08-19 NOTE — [facility] Group Notes (Signed)
[facility] Group Notes:  (Counselor/Nursing/MHT/Case Management/Adjunct)  08/19/2013 1:15PM  Type of Therapy:  Group Therapy  Participation Level:  Active  Participation Quality:  Appropriate  Affect:  Flat  Cognitive:  Oriented  Insight:  Improving  Engagement in Group:  Limited  Engagement in Therapy:  Limited  Modes of Intervention:  Discussion, Exploration and Socialization  Summary of Progress/Problems: The topic for group was balance in life.  Pt participated in the discussion about when their life was in balance and out of balance and how this feels.  Pt discussed ways to get back in balance and short term goals they can work on to get where they want to be. Stayed for most of group today.  Was engaged.  Stated he feels like he's close to balance "because I'm getting an injection today, and my pastor thinks that will help."   Darren [name], Darren [name] 08/19/2013 3:16 PM

## 2013-08-19 NOTE — [facility] Group Notes (Signed)
[facility] Group Notes:  (Nursing/MHT/Case Management/Adjunct)  Date:  08/19/2013  Time:  9:46 AM  Type of Therapy:  Psychoeducational Skills  Participation Level:  Minimal  Participation Quality:  Appropriate and Attentive  Affect:  Appropriate  Cognitive:  Alert and Appropriate  Insight:  Appropriate  Engagement in Group:  Engaged  Modes of Intervention:  Clarification, Socialization and Support  Summary of Progress/Problems: Morning wellness group; Engaged in group but did not say much this am.  [name], [name] 08/19/2013, 9:46 AM

## 2013-08-19 NOTE — Progress Notes (Signed)
Patient ID: Darren [name], male   DOB: 1975-11-22, 38 y.o.   MRN: [id number]   D: Patient still remains flat on approach today. Reports that depression is better but still isolative and not speaking at times. He at time appears paranoid and guarded. Denies any SI/HI or a/v hallucinations. Given Risperdal Consta Injection today IM Z-track in rt ventrogluteal muscle. Patient tolerated well. A: Staff will monitor on q 15 minute checks, follow treatment plan, and give meds as ordered. R: Cooperative on the unit.

## 2013-08-20 MED ORDER — RISPERIDONE 2 MG PO TBDP
2.0000 mg | ORAL_TABLET | Freq: Every day | ORAL | Status: DC
  Administered 2013-08-20 – 2013-08-23 (×4): 2 mg via ORAL
  Filled 2013-08-20 (×5): qty 1
  Filled 2013-08-20: qty 14

## 2013-08-20 NOTE — Progress Notes (Signed)
[facility]  08/20/2013 12:36 PM Darren [name]  MRN:  [id number]  Subjective:   I did not get good nights sleep .  I still have suicidal thoughts and a paranoia.      Objective Patient seen and chart reviewed.  Patient remains very depressed, paranoid and guarded.  He does not participate in the groups.  He was given Risperdal constant injection yesterday .  He denies any side effects.  He has no tremors or shakes.  Patient remains very depressed and he continued to endorse suicidal thoughts.  His appetite is fair.  He is sleeping on and off.  He is guarded.  Diagnosis:   DSM5:  Total Time spent with patient: 20 minutes  Axis I: Schizoaffective Disorder Axis II: Deferred Axis III:  Past Medical History  Diagnosis Date  . Schizophrenia   . Bipolar disorder (manic depression)   . Asthma    Axis IV: other psychosocial or environmental problems, problems related to social environment and problems with primary support group Axis V: 41-50 serious symptoms  ADL's:  Intact  Sleep: Fair  Appetite:  Fair  Suicidal Ideation:  Denies  Homicidal Ideation:  Denies   Psychiatric Specialty Exam: Physical Exam  Constitutional: He is oriented to person, place, and time. He appears well-developed and well-nourished.  HENT:  Head: Normocephalic and atraumatic.  Neck: Normal range of motion.  Respiratory: Effort normal.  Musculoskeletal: Normal range of motion.  Neurological: He is alert and oriented to person, place, and time.    Review of Systems  Constitutional: Positive for malaise/fatigue.  HENT: Negative.   Eyes: Negative.   Respiratory: Negative.   Cardiovascular: Negative.   Gastrointestinal: Negative.   Genitourinary: Negative.   Musculoskeletal: Negative.   Skin: Negative.  Negative for itching and rash.  Neurological: Negative.   Endo/Heme/Allergies: Negative.   Psychiatric/Behavioral: Positive for depression, suicidal ideas and hallucinations.    Blood  pressure 99/74, pulse 92, temperature 97.8 F (36.6 C), temperature source Oral, resp. rate 18, height 5\' 5"  (1.651 m), weight 154 lb (69.854 kg).Body mass index is 25.63 kg/(m^2).  General Appearance: Casual  Eye Contact::  Good  Speech:  Normal Rate  Volume:  Decreased  Mood:  Depressed  Affect:  Blunt  Thought Process:  Coherent  Orientation:  Full (Time, Place, and Person)  Thought Content:  WDL  Suicidal Thoughts:  Yes.  without intent/plan  Homicidal Thoughts:  No  Memory:  Immediate;   Fair Recent;   Fair Remote;   Fair  Judgement:  Fair  Insight:  Fair  Psychomotor Activity:  Normal  Concentration:  Fair  Recall:  [employer]  Fund of Knowledge:Good  Language: Fair  Akathisia:  No  Handed:  Right  AIMS (if indicated):     Assets:  Housing Leisure Time Physical Health Resilience Social Support  Sleep:  Number of Hours: 5.3   Musculoskeletal: Strength & Muscle Tone: within normal limits Gait & Station: normal Patient leans: N/A  Current Medications: Current Facility-Administered Medications  Medication Dose Route Frequency Provider Last Rate Last Dose  . acetaminophen (TYLENOL) tablet 650 mg  650 mg Oral Q6H PRN Kerry HoughSpencer E Simon, PA-C      . albuterol (PROVENTIL HFA;VENTOLIN HFA) 108 (90 BASE) MCG/ACT inhaler 2 puff  2 puff Inhalation Q6H PRN Kerry HoughSpencer E Simon, PA-C   2 puff at 08/18/13 2057  . alum & mag hydroxide-simeth (MAALOX/MYLANTA) 200-200-20 MG/5ML suspension 30 mL  30 mL Oral Q4H PRN Benjaman PottGerald D Taylor, MD      .  benztropine (COGENTIN) tablet 0.5 mg  0.5 mg Oral BID Mojeed Akintayo   0.5 mg at 08/20/13 0809  . citalopram (CELEXA) tablet 10 mg  10 mg Oral Daily Beau Fanny, FNP   10 mg at 08/20/13 0809  . divalproex (DEPAKOTE) DR tablet 250 mg  250 mg Oral BID PC Mojeed Akintayo   250 mg at 08/20/13 0809  . feeding supplement (ENSURE COMPLETE) (ENSURE COMPLETE) liquid 237 mL  237 mL Oral BID BM Tenny Craw, RD   237 mL at 08/19/13 2000  . hydrOXYzine  (ATARAX/VISTARIL) tablet 25 mg  25 mg Oral Q6H PRN Kerry Hough, PA-C   25 mg at 08/18/13 0740  . magnesium hydroxide (MILK OF MAGNESIA) suspension 30 mL  30 mL Oral Daily PRN Kerry Hough, PA-C      . nicotine (NICODERM CQ - dosed in mg/24 hours) patch 21 mg  21 mg Transdermal Daily Benjaman Pott, MD      . OLANZapine zydis (ZYPREXA) disintegrating tablet 10 mg  10 mg Oral Q8H PRN Mojeed Akintayo   10 mg at 08/17/13 1502  . ondansetron (ZOFRAN) tablet 4 mg  4 mg Oral Q8H PRN Benjaman Pott, MD   4 mg at 08/15/13 1643  . risperiDONE (RISPERDAL M-TABS) disintegrating tablet 2 mg  2 mg Oral QHS Cleotis Nipper, MD      . risperiDONE microspheres (RISPERDAL CONSTA) injection 25 mg  25 mg Intramuscular Q14 Days Nanine Means, NP   25 mg at 08/19/13 1439  . traZODone (DESYREL) tablet 100 mg  100 mg Oral QHS Cleotis Nipper, MD   100 mg at 08/19/13 2133    Lab Results: No results found for this or any previous visit (from the past 48 hour(s)).  Physical Findings: AIMS: Facial and Oral Movements Muscles of Facial Expression: None, normal Lips and Perioral Area: None, normal Jaw: None, normal Tongue: None, normal,Extremity Movements Upper (arms, wrists, hands, fingers): None, normal Lower (legs, knees, ankles, toes): None, normal, Trunk Movements Neck, shoulders, hips: None, normal, Overall Severity Severity of abnormal movements (highest score from questions above): None, normal Incapacitation due to abnormal movements: None, normal Patient's awareness of abnormal movements (rate only patient's report): No Awareness,    CIWA:    COWS:     Treatment Plan Summary: Daily contact with patient to assess and evaluate symptoms and progress in treatment Medication management  Plan:   Discontinue Zyprexa.  Start Risperdal 2 mg M tablets at bedtime.  Patient was given Risperdal Consta and patient denies any side effects at this time.  Continue to encourage to participate in group.  Continues Zydis  as a when necessary  Medical Decision Making Problem Points:  Established problem, stable/improving (1) and Review of psycho-social stressors (1) Data Points:  Review of medication regiment & side effects (2) Review of new medications or change in dosage (2)  I certify that inpatient services furnished can reasonably be expected to improve the patient's condition.   Dawt Reeb T.,  08/20/2013, 12:36 PM

## 2013-08-20 NOTE — Progress Notes (Signed)
Patient ID: Darren [name], male   DOB: 04-Jul-1975, 38 y.o.   MRN: [id number]  D: Patient has a flat affect on approach. Appears to be staring and appears paranoid at times. Reports depression better but still isolates and very flat. Denies hearing voices at present but still apears preoccupied. Denies having any side effects from Risperdal Consta injection given yesterday. York SpanielSaid it made him feel "weird" but no specific problems even reporting to night shift that he liked the way it made him feel. Denies SI/HI. A: Staff will monitor on q 15 minute checks, follow treatment plan, and give meds as ordered. R: Cooperative on the unit.

## 2013-08-20 NOTE — [facility] Group Notes (Signed)
[facility] LCSW Group Therapy  08/20/2013  1:05 PM  Type of Therapy:  Group therapy  Participation Level:  Active  Participation Quality:  Attentive  Affect:  Flat  Cognitive:  Oriented  Insight:  Limited  Engagement in Therapy:  Limited  Modes of Intervention:  Discussion, Socialization  Summary of Progress/Problems:  Chaplain was here to lead a group on themes of hope and courage. "Hope is making a leap of faith.  Like even if I am not able to see my children now, and I don't know when I will again, I just have to go forward and take that leap of faith that things will work out the way they are supposed to in the end."   For the most part sat quietly taking it all in.  At the end he stated his hope is to be able to get back to [facility] to see his kids.  [name], Kurtis Anastasia B 08/20/2013 1:39 PM

## 2013-08-20 NOTE — [facility] Group Notes (Signed)
[facility] LCSW Aftercare Discharge Planning Group Note   08/20/2013 8:58 AM  Participation Quality:  Did not attend    Darren [name] B   

## 2013-08-21 NOTE — [facility] Group Notes (Signed)
[facility] Group Notes:  (Clinical Social Work)  08/21/2013  11:00-11:45AM  Summary of Progress/Problems:   The main focus of today's process group was for the patient to identify ways in which they have in the past sabotaged their own recovery and reasons they may have done this/what they received from doing it.  We then worked to identify a specific plan to avoid doing this when discharged from the hospital for this admission.  The patient expressed that his faith is a big component in his wellness, and he finds great help from his pastor providing him "with the Word."  He said that he has recently started on an injection, and like other patients in the group, is looking for way to remember to always go get the injections every other week.  A number of methods were proposed by the group.  [name] himself proposed using sticky notes on the refrigerator or door or bathroom mirror.  Type of Therapy:  Group Therapy - Process  Participation Level:  Active  Participation Quality:  Attentive, Sharing and Supportive  Affect:  Anxious and Blunted  Cognitive:  Oriented  Insight:  Engaged  Engagement in Therapy:  Engaged  Modes of Intervention:  Motivational Interviewing, Discussion  Ambrose MantleMareida Grossman-Orr, LCSW 08/21/2013, 12:08 PM

## 2013-08-21 NOTE — Progress Notes (Signed)
Patient ID: Darren [name], male   DOB: 09/12/1975, 38 y.o.   MRN: [id number] D: Patient pleasant on approach. Pt mood/affect appeared depressed and flat. Patient stated he is doing well and tolerating medication well. Pt reports after discharge to continue taking medications. Pt interaction with peers is minimal. Pt denies SI/HI/AVH and pain.  Cooperative with assessment. No acute distressed noted at this time.   A: Met with pt 1:1. Medications administered as prescribed. Writer encouraged pt to discuss feelings. Pt encouraged to come to staff with any question or concerns.   R: Patient remains safe. He is complaint with medications and denies any adverse reaction. Continue current POC.

## 2013-08-21 NOTE — [facility] Group Notes (Signed)
[facility] Group Notes:  (Nursing/MHT/Case Management/Adjunct)  Date:  08/21/2013  Time:  10:31 AM  Type of Therapy:  Nurse Education  Participation Level:  Minimal  Participation Quality:  Sharing  Affect:  Flat  Cognitive:  Alert  Insight:  Limited  Engagement in Group:  Limited  Modes of Intervention:  Activity, Discussion and Education  Summary of Progress/Problems: Pt was quiet with minimal interactions He did say that music and reading helps with coping and anxiety.  Jule SerKent, Viana Sleep Gail 08/21/2013, 10:31 AM

## 2013-08-21 NOTE — Progress Notes (Signed)
[facility]  08/21/2013 2:58 PM Darren [name]  MRN:  [id number]  Subjective:   I am feeling better.  I had a good night sleep.      Objective Patient seen and chart reviewed.  Patient reported good night sleep.  He also mentioned that Risperdal is helping his depression and paranoia.  He is to have hallucination and suicidal thoughts but they're less intense and less frequent.  Patient is also going to the groups.  He has no tremors or shakes.  His appetite is fair.  He reported his suicidal thoughts are less frequent.  He is still Clinical research associatecannot contract for safety.    Diagnosis:   DSM5:  Total Time spent with patient: 20 minutes  Axis I: Schizoaffective Disorder Axis II: Deferred Axis III:  Past Medical History  Diagnosis Date  . Schizophrenia   . Bipolar disorder (manic depression)   . Asthma    Axis IV: other psychosocial or environmental problems, problems related to social environment and problems with primary support group Axis V: 41-50 serious symptoms  ADL's:  Intact  Sleep: Fair  Appetite:  Fair  Suicidal Ideation:  Denies  Homicidal Ideation:  Denies   Psychiatric Specialty Exam: Physical Exam  Constitutional: He is oriented to person, place, and time. He appears well-developed and well-nourished.  HENT:  Head: Normocephalic and atraumatic.  Neck: Normal range of motion.  Respiratory: Effort normal.  Musculoskeletal: Normal range of motion.  Neurological: He is alert and oriented to person, place, and time.    Review of Systems  Constitutional: Positive for malaise/fatigue.  HENT: Negative.   Eyes: Negative.   Respiratory: Negative.   Cardiovascular: Negative.   Gastrointestinal: Negative.   Genitourinary: Negative.   Musculoskeletal: Negative.   Skin: Negative.  Negative for itching and rash.  Neurological: Negative.   Endo/Heme/Allergies: Negative.   Psychiatric/Behavioral: Positive for depression, suicidal ideas and hallucinations.    Blood  pressure 115/72, pulse 97, temperature 97.7 F (36.5 C), temperature source Oral, resp. rate 16, height 5\' 5"  (1.651 m), weight 154 lb (69.854 kg).Body mass index is 25.63 kg/(m^2).  General Appearance: Casual  Eye Contact::  Good  Speech:  Normal Rate  Volume:  Decreased  Mood:  Depressed  Affect:  Blunt  Thought Process:  Coherent  Orientation:  Full (Time, Place, and Person)  Thought Content:  WDL  Suicidal Thoughts:  Yes.  without intent/plan  Homicidal Thoughts:  No  Memory:  Immediate;   Fair Recent;   Fair Remote;   Fair  Judgement:  Fair  Insight:  Fair  Psychomotor Activity:  Normal  Concentration:  Fair  Recall:  [employer]  Fund of Knowledge:Good  Language: Fair  Akathisia:  No  Handed:  Right  AIMS (if indicated):     Assets:  Housing Leisure Time Physical Health Resilience Social Support  Sleep:  Number of Hours: 5.75   Musculoskeletal: Strength & Muscle Tone: within normal limits Gait & Station: normal Patient leans: N/A  Current Medications: Current Facility-Administered Medications  Medication Dose Route Frequency Provider Last Rate Last Dose  . acetaminophen (TYLENOL) tablet 650 mg  650 mg Oral Q6H PRN Kerry HoughSpencer E Simon, PA-C   650 mg at 08/20/13 2138  . albuterol (PROVENTIL HFA;VENTOLIN HFA) 108 (90 BASE) MCG/ACT inhaler 2 puff  2 puff Inhalation Q6H PRN Kerry HoughSpencer E Simon, PA-C   2 puff at 08/18/13 2057  . alum & mag hydroxide-simeth (MAALOX/MYLANTA) 200-200-20 MG/5ML suspension 30 mL  30 mL Oral Q4H PRN Earvin HansenGerald  Irish Lack, MD      . benztropine (COGENTIN) tablet 0.5 mg  0.5 mg Oral BID Mojeed Akintayo   0.5 mg at 08/21/13 0829  . citalopram (CELEXA) tablet 10 mg  10 mg Oral Daily Beau Fanny, FNP   10 mg at 08/21/13 9604  . divalproex (DEPAKOTE) DR tablet 250 mg  250 mg Oral BID PC Mojeed Akintayo   250 mg at 08/21/13 0829  . feeding supplement (ENSURE COMPLETE) (ENSURE COMPLETE) liquid 237 mL  237 mL Oral BID BM Tenny Craw, RD   237 mL at 08/21/13 1259   . hydrOXYzine (ATARAX/VISTARIL) tablet 25 mg  25 mg Oral Q6H PRN Kerry Hough, PA-C   25 mg at 08/21/13 1002  . magnesium hydroxide (MILK OF MAGNESIA) suspension 30 mL  30 mL Oral Daily PRN Kerry Hough, PA-C      . nicotine (NICODERM CQ - dosed in mg/24 hours) patch 21 mg  21 mg Transdermal Daily Benjaman Pott, MD      . OLANZapine zydis (ZYPREXA) disintegrating tablet 10 mg  10 mg Oral Q8H PRN Mojeed Akintayo   10 mg at 08/17/13 1502  . ondansetron (ZOFRAN) tablet 4 mg  4 mg Oral Q8H PRN Benjaman Pott, MD   4 mg at 08/15/13 1643  . risperiDONE (RISPERDAL M-TABS) disintegrating tablet 2 mg  2 mg Oral QHS Cleotis Nipper, MD   2 mg at 08/20/13 2124  . risperiDONE microspheres (RISPERDAL CONSTA) injection 25 mg  25 mg Intramuscular Q14 Days Nanine Means, NP   25 mg at 08/19/13 1439  . traZODone (DESYREL) tablet 100 mg  100 mg Oral QHS Cleotis Nipper, MD   100 mg at 08/20/13 2124    Lab Results: No results found for this or any previous visit (from the past 48 hour(s)).  Physical Findings: AIMS: Facial and Oral Movements Muscles of Facial Expression: None, normal Lips and Perioral Area: None, normal Jaw: None, normal Tongue: None, normal,Extremity Movements Upper (arms, wrists, hands, fingers): None, normal Lower (legs, knees, ankles, toes): None, normal, Trunk Movements Neck, shoulders, hips: None, normal, Overall Severity Severity of abnormal movements (highest score from questions above): None, normal Incapacitation due to abnormal movements: None, normal Patient's awareness of abnormal movements (rate only patient's report): No Awareness,    CIWA:    COWS:     Treatment Plan Summary: Daily contact with patient to assess and evaluate symptoms and progress in treatment Medication management  Plan:   Continue Risperdal 2 mg M tablets at bedtime.  Patient was given Risperdal Consta and patient denies any side effects at this time.  Continue to encourage to participate in group.   Continues Zydis as a when necessary  Medical Decision Making Problem Points:  Established problem, stable/improving (1) and Review of psycho-social stressors (1) Data Points:  Review of medication regiment & side effects (2) Review of new medications or change in dosage (2)  I certify that inpatient services furnished can reasonably be expected to improve the patient's condition.   Lakoda Mcanany T.,  08/21/2013, 2:58 PM

## 2013-08-21 NOTE — Progress Notes (Signed)
[facility] Group Notes:  (Nursing/MHT/Case Management/Adjunct)  Date:  08/21/2013  Time:  10:05 PM  Type of Therapy:  Psychoeducational Skills  Participation Level:  Active  Participation Quality:  Appropriate  Affect:  Appropriate  Cognitive:  Appropriate  Insight:  Appropriate  Engagement in Group:  Developing/Improving  Modes of Intervention:  Education  Summary of Progress/Problems: The patient verbalized that his day was "all right" overall. He states that he went outside today and played basketball. He also verbalized that he spent some of his time reading his Bible and looking up some definitions. As a theme for the day, his relapse prevention will include prayer.   [name] 08/21/2013, 10:05 PM

## 2013-08-21 NOTE — Progress Notes (Signed)
D. Pt has been up and has been visible in milieu this evening, attending and participating in various activities. Pt did endorse anxiety today and spoke some of how will include reading the bible and prayer into daily activities. Pt received all medications without incident and spoke briefly about being on an injectable medication.  A.Support and encouragement provided, medication education provided. R. Pt verbalized understanding, safety maintained.

## 2013-08-21 NOTE — Progress Notes (Signed)
Pt has been up and active today.  He denies any depression or hopelessness and did admit to anxiety and requested something around 1002 and was given vistaril po which he stated, "it helped" He did not put that response on his self-inventory.  He denies any S/H ideation or A/V/H.  He does appear to be responding to internal stimuli with blank affect and eye contact starring at times.

## 2013-08-22 LAB — VALPROIC ACID LEVEL: Valproic Acid Lvl: 53.2 ug/mL (ref 50.0–100.0)

## 2013-08-22 NOTE — Progress Notes (Signed)
[facility]  08/22/2013 12:31 PM Darren [name]  MRN:  [id number]  Subjective:   My voices are getting better.  I am feeling better.        Objective Patient seen and chart reviewed.  Patient is getting better on Risperdal .  He denies any side effects.  He reported less intensity of voices and hallucinations.  He is to have depression and suicidal thoughts but they're less frequent from the past.  Patient lives with his pastor .  [place] .  He is going to the groups however sometime he noticed isolated and withdrawn .  He does not participate in the groups.  He has no tremors or shakes.  His sleep is improved from the past.  Diagnosis:   DSM5:  Total Time spent with patient: 20 minutes  Axis I: Schizoaffective Disorder Axis II: Deferred Axis III:  Past Medical History  Diagnosis Date  . Schizophrenia   . Bipolar disorder (manic depression)   . Asthma    Axis IV: other psychosocial or environmental problems, problems related to social environment and problems with primary support group Axis V: 41-50 serious symptoms  ADL's:  Intact  Sleep: Fair  Appetite:  Fair  Suicidal Ideation:  Denies  Homicidal Ideation:  Denies   Psychiatric Specialty Exam: Physical Exam  Constitutional: He is oriented to person, place, and time. He appears well-developed and well-nourished.  HENT:  Head: Normocephalic and atraumatic.  Neck: Normal range of motion.  Respiratory: Effort normal.  Musculoskeletal: Normal range of motion.  Neurological: He is alert and oriented to person, place, and time.    Review of Systems  Constitutional: Positive for malaise/fatigue.  HENT: Negative.   Eyes: Negative.   Respiratory: Negative.   Cardiovascular: Negative.   Gastrointestinal: Negative.   Genitourinary: Negative.   Musculoskeletal: Negative.   Skin: Negative.  Negative for itching and rash.  Neurological: Negative.   Endo/Heme/Allergies: Negative.    Psychiatric/Behavioral: Positive for depression, suicidal ideas and hallucinations.    Blood pressure 124/71, pulse 97, temperature 97.6 F (36.4 C), temperature source Oral, resp. rate 16, height 5\' 5"  (1.651 m), weight 154 lb (69.854 kg).Body mass index is 25.63 kg/(m^2).  General Appearance: Casual  Eye Contact::  Good  Speech:  Normal Rate  Volume:  Decreased  Mood:  Depressed  Affect:  Blunt  Thought Process:  Coherent  Orientation:  Full (Time, Place, and Person)  Thought Content:  WDL  Suicidal Thoughts:  Yes.  without intent/plan  Homicidal Thoughts:  No  Memory:  Immediate;   Fair Recent;   Fair Remote;   Fair  Judgement:  Fair  Insight:  Fair  Psychomotor Activity:  Normal  Concentration:  Fair  Recall:  [employer]Fair  Fund of Knowledge:Good  Language: Fair  Akathisia:  No  Handed:  Right  AIMS (if indicated):     Assets:  Housing Leisure Time Physical Health Resilience Social Support  Sleep:  Number of Hours: 6.5   Musculoskeletal: Strength & Muscle Tone: within normal limits Gait & Station: normal Patient leans: N/A  Current Medications: Current Facility-Administered Medications  Medication Dose Route Frequency Provider Last Rate Last Dose  . acetaminophen (TYLENOL) tablet 650 mg  650 mg Oral Q6H PRN Kerry HoughSpencer E Simon, PA-C   650 mg at 08/20/13 2138  . albuterol (PROVENTIL HFA;VENTOLIN HFA) 108 (90 BASE) MCG/ACT inhaler 2 puff  2 puff Inhalation Q6H PRN Kerry HoughSpencer E Simon, PA-C   2 puff at 08/18/13 2057  .  alum & mag hydroxide-simeth (MAALOX/MYLANTA) 200-200-20 MG/5ML suspension 30 mL  30 mL Oral Q4H PRN Benjaman Pott, MD      . benztropine (COGENTIN) tablet 0.5 mg  0.5 mg Oral BID Mojeed Akintayo   0.5 mg at 08/22/13 0742  . citalopram (CELEXA) tablet 10 mg  10 mg Oral Daily Beau Fanny, FNP   10 mg at 08/22/13 1610  . divalproex (DEPAKOTE) DR tablet 250 mg  250 mg Oral BID PC Mojeed Akintayo   250 mg at 08/22/13 0742  . feeding supplement (ENSURE COMPLETE)  (ENSURE COMPLETE) liquid 237 mL  237 mL Oral BID BM Tenny Craw, RD   237 mL at 08/21/13 2225  . hydrOXYzine (ATARAX/VISTARIL) tablet 25 mg  25 mg Oral Q6H PRN Kerry Hough, PA-C   25 mg at 08/21/13 1002  . magnesium hydroxide (MILK OF MAGNESIA) suspension 30 mL  30 mL Oral Daily PRN Kerry Hough, PA-C      . OLANZapine zydis (ZYPREXA) disintegrating tablet 10 mg  10 mg Oral Q8H PRN Mojeed Akintayo   10 mg at 08/17/13 1502  . ondansetron (ZOFRAN) tablet 4 mg  4 mg Oral Q8H PRN Benjaman Pott, MD   4 mg at 08/15/13 1643  . risperiDONE (RISPERDAL M-TABS) disintegrating tablet 2 mg  2 mg Oral QHS Cleotis Nipper, MD   2 mg at 08/21/13 2224  . risperiDONE microspheres (RISPERDAL CONSTA) injection 25 mg  25 mg Intramuscular Q14 Days Nanine Means, NP   25 mg at 08/19/13 1439  . traZODone (DESYREL) tablet 100 mg  100 mg Oral QHS Cleotis Nipper, MD   100 mg at 08/21/13 2224    Lab Results: No results found for this or any previous visit (from the past 48 hour(s)).  Physical Findings: AIMS: Facial and Oral Movements Muscles of Facial Expression: None, normal Lips and Perioral Area: None, normal Jaw: None, normal Tongue: None, normal,Extremity Movements Upper (arms, wrists, hands, fingers): None, normal Lower (legs, knees, ankles, toes): None, normal, Trunk Movements Neck, shoulders, hips: None, normal, Overall Severity Severity of abnormal movements (highest score from questions above): None, normal Incapacitation due to abnormal movements: None, normal Patient's awareness of abnormal movements (rate only patient's report): No Awareness,    CIWA:    COWS:     Treatment Plan Summary: Daily contact with patient to assess and evaluate symptoms and progress in treatment Medication management  Plan:   Patient is showing improvement from the past.  Continue Risperdal 2 mg M tablets at bedtime and trazodone 100 mg at bedtime..  Patient is taking Depakote 250 mg twice a day.  We will do a  Depakote level tomorrow morning.   Patient denies any side effects of the medication .  He denies any tremors or shakes.  Continue to encourage to participate in group.  Continues Zydis as a prn for agitation.    Medical Decision Making Problem Points:  Established problem, stable/improving (1) and Review of psycho-social stressors (1) Data Points:  Review of medication regiment & side effects (2) Review of new medications or change in dosage (2)  I certify that inpatient services furnished can reasonably be expected to improve the patient's condition.   Delany Steury T.,  08/22/2013, 12:31 PM

## 2013-08-22 NOTE — [facility] Group Notes (Signed)
[facility] Group Notes:  (Nursing/MHT/Case Management/Adjunct)  Date:  08/22/2013  Time:  11:31 AM  Type of Therapy:  Nurse Education  Participation Level:  Minimal  Participation Quality:  Appropriate  Affect:  Appropriate  Cognitive:  Alert and Appropriate  Insight:  Appropriate  Engagement in Group:  Engaged and Limited  Modes of Intervention:  Activity, Discussion and Education  Summary of Progress/Problems:  Darren [name], Darren [name] 08/22/2013, 11:31 AM

## 2013-08-22 NOTE — Progress Notes (Signed)
[facility] Group Notes:  (Nursing/MHT/Case Management/Adjunct)  Date:  08/22/2013  Time:  10:33 PM  Type of Therapy:  Psychoeducational Skills  Participation Level:  Active  Participation Quality:  Appropriate  Affect:  Appropriate  Cognitive:  Lacking  Insight:  Lacking  Engagement in Group:  Developing/Improving  Modes of Intervention:  Education  Summary of Progress/Problems: The patient described his day as having been "wonderful", but offered few additional details except to say that he went outdoors. In terms of the theme for the day, his support system will consist of his pastor.   Hazle CocaGOODMAN, Shaneese Tait S 08/22/2013, 10:33 PM

## 2013-08-22 NOTE — Progress Notes (Signed)
Pt has been up for groups today.  He has been appropriate thus far.  He denied any A/V/H at first but informed pt that if was hearing voices or seeing anything that it would be all right to admit to that so he can get medication to help.  He did admit to voices and still denied any V/H.  He was given zyprexa 10 po at 1309 which he reported relief.  He denied any depression or anxiety on his self-inventory.  His gaol today "take medications to keep hallucinations down" and planned to continue with groups and his medications to help him.

## 2013-08-22 NOTE — [facility] Group Notes (Signed)
[facility] Group Notes:  (Nursing/MHT/Case Management/Adjunct)  Date:  08/22/2013  Time:  11:29 AM  Type of Therapy:  Nurse Education  Participation Level:  Minimal  Participation Quality:  Appropriate  Affect:  Appropriate  Cognitive:  Alert  Insight:  Limited  Engagement in Group:  Engaged and Limited  Modes of Intervention:  Activity, Discussion and Education  Summary of Progress/Problems:  Darren [name], Darren [name] 08/22/2013, 11:29 AM

## 2013-08-22 NOTE — Progress Notes (Signed)
Took over Pt's care at 2330.  Resting in bed with eyes closed, respirations even and unlabored.  No distress noted.  Will continue to monitor.

## 2013-08-22 NOTE — Progress Notes (Signed)
D. Pt has been up and has been visible in milieu, attending and participating in various activities, however participation is minimal. Pt did endorse auditory hallucinations but reports that the voices are not bothering him. Pt also reports having increased anxiety but denies any SI. Pt has received medications this evening without incident. A. Support and encouragement provided. R. Safety maintained, will continue to monitor.

## 2013-08-22 NOTE — [facility] Group Notes (Signed)
[facility] Group Notes:  (Nursing/MHT/Case Management/Adjunct)  Date:  08/22/2013  Time:  9:20 AM  Type of Therapy:  Nurse Education  Participation Level:  Minimal  Participation Quality:  Appropriate  Affect:  Flat  Cognitive:  Appropriate  Insight:  Improving  Engagement in Group:  Engaged and Limited  Modes of Intervention:  Activity, Discussion and Education  Summary of Progress/Problems:Pt was quiet with minimal interactions     Jule SerKent, Niyam Bisping Gail 08/22/2013, 9:20 AM

## 2013-08-22 NOTE — [facility] Group Notes (Signed)
[facility] Group Notes:  (Clinical Social Work)  08/22/2013   11:15am-12:00pm  Summary of Progress/Problems:  The main focus of today's process group was to listen to a variety of genres of music and to identify that different types of music provoke different responses.  The patient then was able to identify personally what was soothing for them, as well as energizing.  Handouts were used to record feelings evoked, as well as how patient can personally use this knowledge in sleep habits, with depression, and with other symptoms.  The patient expressed understanding of concepts, as well as knowledge of how each type of music affected him/her and how this can be used at home as a wellness/recovery tool.  He sang throughout group, sat smiling most of the time.  Type of Therapy:  Music Therapy   Participation Level:  Active  Participation Quality:  Attentive and Sharing  Affect:  Blunted  Cognitive:  Oriented  Insight:  Engaged  Engagement in Therapy:  Engaged  Modes of Intervention:   Activity, Exploration  Ambrose MantleMareida Grossman-Orr, LCSW 08/22/2013, 12:30pm

## 2013-08-23 MED ORDER — BENZTROPINE MESYLATE 1 MG PO TABS
1.0000 mg | ORAL_TABLET | Freq: Every day | ORAL | Status: DC
  Filled 2013-08-23: qty 1
  Filled 2013-08-23: qty 14

## 2013-08-23 MED ORDER — IBUPROFEN 200 MG PO TABS
600.0000 mg | ORAL_TABLET | Freq: Three times a day (TID) | ORAL | Status: DC | PRN
  Administered 2013-08-23: 600 mg via ORAL
  Filled 2013-08-23: qty 3

## 2013-08-23 NOTE — Progress Notes (Signed)
Patient ID: Darren [name], male   DOB: 01-Feb-1975, 38 y.o.   MRN: [id number] [facility] MD Progress Note  08/23/2013 11:02 AM Darren [name]  MRN:  [id number]  Subjective:   Patient states: "I am still hearing voices but not as bad as before.''   Objective Patient seen and chart reviewed.  Patient reports decreasing auditory/visual hallucinations, delusions and depressive symptoms. He states that his medications are helping, rates his depression at 4/10. Patient denies suicidal or homicidal ideations, intent or plan. Patient has been attending the unit milieu and takes his medications regularly.  Diagnosis:   DSM5:  Total Time spent with patient: 20 minutes  Axis I: Schizoaffective Disorder Axis II: Deferred Axis III:  Past Medical History  Diagnosis Date  . Schizophrenia   . Bipolar disorder (manic depression)   . Asthma    Axis IV: other psychosocial or environmental problems, problems related to social environment and problems with primary support group Axis V: 41-50 serious symptoms  ADL's:  Intact  Sleep: Fair  Appetite:  Fair  Suicidal Ideation:  Denies  Homicidal Ideation:  Denies   Psychiatric Specialty Exam: Physical Exam  Constitutional: He is oriented to person, place, and time. He appears well-developed and well-nourished.  HENT:  Head: Normocephalic and atraumatic.  Neck: Normal range of motion.  Respiratory: Effort normal.  Musculoskeletal: Normal range of motion.  Neurological: He is alert and oriented to person, place, and time.    Review of Systems  Constitutional: Positive for malaise/fatigue.  HENT: Negative.   Eyes: Negative.   Respiratory: Negative.   Cardiovascular: Negative.   Gastrointestinal: Negative.   Genitourinary: Negative.   Musculoskeletal: Negative.   Skin: Negative.  Negative for itching and rash.  Neurological: Negative.   Endo/Heme/Allergies: Negative.   Psychiatric/Behavioral: Positive for depression, suicidal ideas and  hallucinations.    Blood pressure 122/84, pulse 102, temperature 98.8 F (37.1 C), temperature source Oral, resp. rate 16, height 5\' 5"  (1.651 m), weight 69.854 kg (154 lb), SpO2 100.00%.Body mass index is 25.63 kg/(m^2).  General Appearance: Casual  Eye Contact::  Good  Speech:  Normal Rate  Volume:  Decreased  Mood:  Depressed  Affect:  Blunt  Thought Process:  Coherent  Orientation:  Full (Time, Place, and Person)  Thought Content:  WDL  Suicidal Thoughts:  Yes.  without intent/plan  Homicidal Thoughts:  No  Memory:  Immediate;   Fair Recent;   Fair Remote;   Fair  Judgement:  Fair  Insight:  Fair  Psychomotor Activity:  Normal  Concentration:  Fair  Recall:  [employer] of Knowledge:Good  Language: Fair  Akathisia:  No  Handed:  Right  AIMS (if indicated):     Assets:  Housing Leisure Time Physical Health Resilience Social Support  Sleep:  Number of Hours: 6.25   Musculoskeletal: Strength & Muscle Tone: within normal limits Gait & Station: normal Patient leans: N/A  Current Medications: Current Facility-Administered Medications  Medication Dose Route Frequency Provider Last Rate Last Dose  . albuterol (PROVENTIL HFA;VENTOLIN HFA) 108 (90 BASE) MCG/ACT inhaler 2 puff  2 puff Inhalation Q6H PRN Kerry Hough, PA-C   2 puff at 08/18/13 2057  . alum & mag hydroxide-simeth (MAALOX/MYLANTA) 200-200-20 MG/5ML suspension 30 mL  30 mL Oral Q4H PRN Benjaman Pott, MD      . Melene Muller ON 08/24/2013] benztropine (COGENTIN) tablet 1 mg  1 mg Oral QHS Morey Andonian      . citalopram (CELEXA) tablet 10 mg  10 mg  Oral Daily Beau FannyJohn C Withrow, FNP   10 mg at 08/23/13 1001  . feeding supplement (ENSURE COMPLETE) (ENSURE COMPLETE) liquid 237 mL  237 mL Oral BID BM Tenny CrawHeather S Winkler, RD   237 mL at 08/22/13 2120  . hydrOXYzine (ATARAX/VISTARIL) tablet 25 mg  25 mg Oral Q6H PRN Kerry HoughSpencer E Simon, PA-C   25 mg at 08/21/13 1002  . ibuprofen (ADVIL,MOTRIN) tablet 600 mg  600 mg Oral Q8H PRN  Alton Tremblay      . magnesium hydroxide (MILK OF MAGNESIA) suspension 30 mL  30 mL Oral Daily PRN Kerry HoughSpencer E Simon, PA-C      . OLANZapine zydis (ZYPREXA) disintegrating tablet 10 mg  10 mg Oral Q8H PRN Valla Pacey   10 mg at 08/22/13 1309  . ondansetron (ZOFRAN) tablet 4 mg  4 mg Oral Q8H PRN Benjaman PottGerald D Taylor, MD   4 mg at 08/15/13 1643  . risperiDONE (RISPERDAL M-TABS) disintegrating tablet 2 mg  2 mg Oral QHS Cleotis NipperSyed T Arfeen, MD   2 mg at 08/22/13 2119  . risperiDONE microspheres (RISPERDAL CONSTA) injection 25 mg  25 mg Intramuscular Q14 Days Nanine MeansJamison Lord, NP   25 mg at 08/19/13 1439  . traZODone (DESYREL) tablet 100 mg  100 mg Oral QHS Cleotis NipperSyed T Arfeen, MD   100 mg at 08/22/13 2119    Lab Results:  Results for orders placed during the hospital encounter of 08/11/13 (from the past 48 hour(s))  VALPROIC ACID LEVEL     Status: None   Collection Time    08/22/13  7:25 PM      Result Value Ref Range   Valproic Acid Lvl 53.2  50.0 - 100.0 ug/mL   Comment: [facility]    Physical Findings: AIMS: Facial and Oral Movements Muscles of Facial Expression: None, normal Lips and Perioral Area: None, normal Jaw: None, normal Tongue: None, normal,Extremity Movements Upper (arms, wrists, hands, fingers): None, normal Lower (legs, knees, ankles, toes): None, normal, Trunk Movements Neck, shoulders, hips: None, normal, Overall Severity Severity of abnormal movements (highest score from questions above): None, normal Incapacitation due to abnormal movements: None, normal Patient's awareness of abnormal movements (rate only patient's report): No Awareness,    CIWA:    COWS:     Treatment Plan Summary: Daily contact with patient to assess and evaluate symptoms and progress in treatment Medication management  Plan:   Lab reviewed: Platelets level:147, will discontinue Depakote due to low platelets. Will continue Risperdal 2 mg M-tabs daily at bedtime and trazodone 100 mg at  bedtime. Change Cogentin to 1mg  po qhs for EPS prevention.   CBC with diff on 08/24/13  Medical Decision Making Problem Points:  Established problem,improving (1) and Review of psycho-social stressors (1) Data Points:  Review of medication regiment & side effects (2) Review of new medications or change in dosage (2)  I certify that inpatient services furnished can reasonably be expected to improve the patient's condition.   [name], Amay Mijangos, MD 08/23/2013, 11:02 AM

## 2013-08-23 NOTE — Progress Notes (Signed)
Adult Psychoeducational Group Note  Date:  08/23/2013 Time:  10:13 PM  Group Topic/Focus:  Wrap-Up Group:   The focus of this group is to help patients review their daily goal of treatment and discuss progress on daily workbooks.  Participation Level:  Minimal  Participation Quality:  Appropriate  Affect:  Appropriate  Cognitive:  Oriented  Insight: Limited  Engagement in Group:  Engaged  Modes of Intervention:  Socialization and Support  Additional Comments:  Patient attended and participated in group tonight. He reports having a good day. He went outside and enjoyed the day.  He read, have fun singing and went for his meals. When he is well he likes to lift weights, exercise, and play basketball.  Lita MainsFrancis, Casimira Sutphin [facility] 08/23/2013, 10:13 PM

## 2013-08-23 NOTE — Progress Notes (Signed)
D   Pt reported he fell in his bathroom at around noon today but did not tell anybody   He requested a yellow armband because he fell   Pt said he is ok and he was looked over no redness or swelling anywhere   Pt is to be discharged tomorrow and pt said he is ready  A   Verbal support given   Medications administered and effectiveness monitored   Q 15 min checks R   Pt safe at present

## 2013-08-23 NOTE — [facility] Group Notes (Signed)
[facility]   08/23/2013 10:50 AM  Participation Quality:  Did not attend    [place], Maitland Lesiak B

## 2013-08-23 NOTE — B[facility] Group Notes (Signed)
[facility] LCSW Group Therapy  08/23/2013 1:15 pm  Type of Therapy: Process Group Therapy  Participation Level:  Active  Participation Quality:  Appropriate  Affect:  Flat  Cognitive:  Oriented  Insight:  Improving  Engagement in Group:  Limited  Engagement in Therapy:  Limited  Modes of Intervention:  Activity, Clarification, Education, Problem-solving and Support  Summary of Progress/Problems: Today's group addressed the issue of overcoming obstacles.  Patients were asked to identify their biggest obstacle post d/c that stands in the way of their on-going success, and then problem solve as to how to manage this.  [name] states that he feels he is at 75%, and that is a big improvement over when he came in.  I asked what it would take for him to get to 79%.  He stated he knows he needs to stay on his meds, and described the process of going off meds and becoming increasingly more paranoid and isolative.  "I know I don't cope very well when I am like that."  His motivation continues to be his [place] in [place].  "I want to get well and see them."  Ida Rogueorth, Timica Marcom B 08/23/2013   3:30 PM

## 2013-08-23 NOTE — Progress Notes (Addendum)
Patient ID: Darren [name], male   DOB: [birth year], 38 y.o.   MRN: [id number]  D: Pt. Denies SI/HI and visual Hallucinations. Patient does report auditory hallucinations and describes them as "loud noise." Patient does not report any pain or discomfort during first interaction but later reports pain in mid chest. Patient is given maalox for indigestion but reports no relief. Patient is then given  PRN Ibuprofen and reports a slight decrease in pain. MD was notified of patient's complaint of chest pain. EKG was done and NP reviewed this and it was placed on chart. No significant findings other than tachycardia (pulse 101). Patient then received PRN Vistaril for anxiety.  A: Support and encouragement provided to the patient to come to writer with any questions or concerns. Patient was sleeping and would not wake up for medications thus morning medications were administered later.   R: Patient is receptive and cooperative but minimal. Patient appears to be smiling more than previously. Patient is seen in the milieu watching tv and talking to other peers. Q15 minute checks are maintained for safety.

## 2013-08-23 NOTE — Progress Notes (Signed)
Took over Pt's care at 2330.  Pt pleasant on approach, in room in bed.  No complaints voiced.  Will continue to monitor, Pt remains safe on unit.

## 2013-08-24 LAB — CBC WITH DIFFERENTIAL/PLATELET
BASOS ABS: 0 10*3/uL (ref 0.0–0.1)
BASOS PCT: 0 % (ref 0–1)
EOS PCT: 2 % (ref 0–5)
Eosinophils Absolute: 0.2 10*3/uL (ref 0.0–0.7)
HCT: 42.3 % (ref 39.0–52.0)
Hemoglobin: 14 g/dL (ref 13.0–17.0)
LYMPHS PCT: 32 % (ref 12–46)
Lymphs Abs: 2.4 10*3/uL (ref 0.7–4.0)
MCH: 28.9 pg (ref 26.0–34.0)
MCHC: 33.1 g/dL (ref 30.0–36.0)
MCV: 87.2 fL (ref 78.0–100.0)
Monocytes Absolute: 1 10*3/uL (ref 0.1–1.0)
Monocytes Relative: 13 % — ABNORMAL HIGH (ref 3–12)
Neutro Abs: 4 10*3/uL (ref 1.7–7.7)
Neutrophils Relative %: 53 % (ref 43–77)
Platelets: 153 10*3/uL (ref 150–400)
RBC: 4.85 MIL/uL (ref 4.22–5.81)
RDW: 14.4 % (ref 11.5–15.5)
WBC: 7.6 10*3/uL (ref 4.0–10.5)

## 2013-08-24 MED ORDER — HYDROXYZINE HCL 25 MG PO TABS: 25.0000 mg | ORAL_TABLET | Freq: Four times a day (QID) | ORAL | Status: DC | PRN

## 2013-08-24 MED ORDER — RISPERIDONE 2 MG PO TBDP: 2.0000 mg | ORAL_TABLET | Freq: Every day | ORAL | Status: DC

## 2013-08-24 MED ORDER — RISPERIDONE MICROSPHERES 25 MG IM SUSR: 25.0000 mg | INTRAMUSCULAR | Status: DC

## 2013-08-24 MED ORDER — BENZTROPINE MESYLATE 1 MG PO TABS: 1.0000 mg | ORAL_TABLET | Freq: Every day | ORAL | Status: DC

## 2013-08-24 MED ORDER — CITALOPRAM HYDROBROMIDE 10 MG PO TABS: 10.0000 mg | ORAL_TABLET | Freq: Every day | ORAL | Status: DC

## 2013-08-24 MED ORDER — TRAZODONE HCL 100 MG PO TABS: 100.0000 mg | ORAL_TABLET | Freq: Every day | ORAL | Status: DC

## 2013-08-24 NOTE — [facility] Suicide Risk Assessment (Signed)
   Demographic Factors:  Male, Low socioeconomic status and Unemployed  Total Time spent with patient: 20 minutes  Psychiatric Specialty Exam: Physical Exam  Psychiatric: He has a normal mood and affect. His speech is normal and behavior is normal. Judgment and thought content normal. Cognition and memory are normal.    Review of Systems  Constitutional: Negative.   HENT: Negative.   Eyes: Negative.   Respiratory: Negative.   Cardiovascular: Negative.   Gastrointestinal: Negative.   Genitourinary: Negative.   Musculoskeletal: Negative.   Skin: Negative.   Neurological: Negative.   Endo/Heme/Allergies: Negative.   Psychiatric/Behavioral: Negative.     Blood pressure 116/79, pulse 90, temperature 97.9 F (36.6 C), temperature source Oral, resp. rate 20, height 5\' 5"  (1.651 m), weight 69.854 kg (154 lb), SpO2 100.00%.Body mass index is 25.63 kg/(m^2).  General Appearance: Fairly Groomed  Patent attorneyye Contact::  Good  Speech:  Clear and Coherent  Volume:  Normal  Mood:  Euphoric  Affect:  Appropriate  Thought Process:  Goal Directed  Orientation:  Full (Time, Place, and Person)  Thought Content:  Negative  Suicidal Thoughts:  No  Homicidal Thoughts:  No  Memory:  Immediate;   Fair Recent;   Fair Remote;   Fair  Judgement:  Fair  Insight:  Fair  Psychomotor Activity:  Normal  Concentration:  Fair  Recall:  [employer]  Fund of Knowledge:Fair  Language: Fair  Akathisia:  No  Handed:  Right  AIMS (if indicated):     Assets:  Communication Skills Desire for Improvement  Sleep:  Number of Hours: 6    Musculoskeletal: Strength & Muscle Tone: within normal limits Gait & Station: normal Patient leans: N/A   Mental Status Per Nursing Assessment::   On Admission:  Suicidal ideation indicated by patient  Current Mental Status by Physician: patient denies suicidal ideation, intent or plan  Loss Factors: Financial problems/change in socioeconomic status  Historical  Factors: NA  Risk Reduction Factors:   Sense of responsibility to family and Positive social support  Continued Clinical Symptoms:  Resolving psychosis and delusions   Cognitive Features That Contribute To Risk:  Closed-mindedness Polarized thinking    Suicide Risk:  Minimal: No identifiable suicidal ideation.  Patients presenting with no risk factors but with morbid ruminations; may be classified as minimal risk based on the severity of the depressive symptoms  Discharge Diagnoses:   AXIS I:  Schizoaffective disorder, bipolar type  AXIS II:  Cluster C Traits AXIS III:   Past Medical History  Diagnosis Date  . Asthma    AXIS IV:  economic problems, housing problems, other psychosocial or environmental problems and problems related to social environment AXIS V:  61-70 mild symptoms  Plan Of Care/Follow-up recommendations:  Activity:  as tolerated Diet:  healthy Tests:  routine Other:  patient to keep his after care appointment  Is patient on multiple antipsychotic therapies at discharge:  No   Has Patient had three or more failed trials of antipsychotic monotherapy by history:  No  Recommended Plan for Multiple Antipsychotic Therapies: NA    Darren [name], Darren Koskela, MD 08/24/2013, 9:32 AM

## 2013-08-24 NOTE — Progress Notes (Signed)
[facility] :  Will you be returning to the same living situation after discharge: Yes,  home At discharge, do you have transportation home?:Yes,  family Do you have the ability to pay for your medications:Yes,  mental health  Release of information consent forms completed and in the chart;  Patient's signature needed at discharge.  Patient to Follow up at: Follow-up Information   Follow up with [facility]. (Go to the walk-in clinic M-F between 8 and 9AM for your hospital follow up appointment)    Contact information:   [address]  [place]  [phone]      Patient denies SI/HI:   Yes,  yes    Safety Planning and Suicide Prevention discussed:  Yes,  yes  Ida Rogueorth, Raequon Catanzaro B 08/24/2013, 12:26 PM

## 2013-08-24 NOTE — Progress Notes (Signed)
Patient ID: Darren [name], male   DOB: [birth year], 38 y.o.   MRN: [id number] He has been discharged home and was picked up by a friend. He voiced understanding of discharge instruction and of the follow up plan.  He denied SI thoughts and hallucinations. He contracts for safety and all belongings were taken home with him.

## 2013-08-24 NOTE — Discharge Summary (Signed)
Physician Discharge Summary Note  Patient:  Darren [name] is an 38 y.o., male MRN:  [id number] DOB:  [birth year] Patient phone:  [phone] (home)  Patient address:   [place] [address],  Total Time spent with patient: 30 minutes  Date of Admission:  08/11/2013 Date of Discharge: 08/24/2013  Reason for Admission:  Visual Hallucinations  Discharge Diagnoses: Principal Problem:   Schizoaffective disorder, bipolar type Active Problems:   Psychotic disorder   Psychiatric Specialty Exam: Physical Exam  ROS  Blood pressure 116/79, pulse 90, temperature 97.9 F (36.6 C), temperature source Oral, resp. rate 20, height 5\' 5"  (1.651 m), weight 69.854 kg (154 lb), SpO2 100.00%.Body mass index is 25.63 kg/(m^2).   General Appearance: Fairly Groomed   Patent attorney:: Good   Speech: Clear and Coherent   Volume: Normal   Mood: Euphoric   Affect: Appropriate   Thought Process: Goal Directed   Orientation: Full (Time, Place, and Person)   Thought Content: Negative   Suicidal Thoughts: No   Homicidal Thoughts: No   Memory: Immediate; Fair  Recent; Fair  Remote; Fair   Judgement: Fair   Insight: Fair   Psychomotor Activity: Normal   Concentration: Fair   Recall: [employer] of Knowledge:Fair   Language: Fair   Akathisia: No   Handed: Right   AIMS (if indicated):   Assets: Communication Skills  Desire for Improvement   Sleep: Number of Hours: 6    Musculoskeletal:  Strength & Muscle Tone: within normal limits  Gait & Station: normal  Patient leans: N/A    DSM5:  AXIS I: Schizoaffective disorder, bipolar type  AXIS II: Cluster C Traits  AXIS III:  Past Medical History   Diagnosis  Date   .  Asthma    AXIS IV: economic problems, housing problems, other psychosocial or environmental problems and problems related to social environment  AXIS V: 61-70 mild symptoms   Level of Care:  OP  Hospital Course:  Darren [name] is a 38 year old male with history of  schizophrenia who presented to the [facility] reporting auditory and visual hallucinations. He reported that the voices were telling him to hurt himself and other people. The patient ran out of his medications 3-4 months ago due to financial problems. Patient states during his psychiatric admission assessment "I am depressed. I hear voices. It's like several conversations at once but they are all mumbled. I also feel like I am watching a movie all the time. The voices can get very loud. They told me to try to cut my wrist. I don't really take my medications like I should." Darren [name] [facility]. He was cooperative with the admission assessment. The patient has a very superficial abrasion to his right wrist. He is able to contract for safety on the unit.   During Hospitalization: Medications managed, psychoeducation, group and individual therapy. Pt currently denies SI, HI, and Psychosis. At discharge, pt rates anxiety and anxiety as minimal. Pt states that he does have a good supportive home environment and will followup with outpatient treatment. Affirms agreement with medication regimen and discharge plan. Denies other physical and psychological concerns at time of discharge.   Consults:  None  Significant Diagnostic Studies:  None  Discharge Vitals:   Blood pressure 116/79, pulse 90, temperature 97.9 F (36.6 C), temperature source Oral, resp. rate 20, height 5\' 5"  (1.651 m), weight 69.854 kg (154 lb), SpO2 100.00%. Body mass index is 25.63  kg/(m^2). Lab Results:   Results for orders placed during the hospital encounter of 08/11/13 (from the past 72 hour(s))  VALPROIC ACID LEVEL     Status: None   Collection Time    08/22/13  7:25 PM      Result Value Ref Range   Valproic Acid Lvl 53.2  50.0 - 100.0 ug/mL   Comment: Performed at [facility]  CBC WITH DIFFERENTIAL     Status: Abnormal   Collection Time    08/24/13  6:25 AM      Result Value Ref Range   WBC  7.6  4.0 - 10.5 K/uL   RBC 4.85  4.22 - 5.81 MIL/uL   Hemoglobin 14.0  13.0 - 17.0 g/dL   HCT 13.2  44.0 - 10.2 %   MCV 87.2  78.0 - 100.0 fL   MCH 28.9  26.0 - 34.0 pg   MCHC 33.1  30.0 - 36.0 g/dL   RDW 72.5  36.6 - 44.0 %   Platelets 153  150 - 400 K/uL   Neutrophils Relative % 53  43 - 77 %   Neutro Abs 4.0  1.7 - 7.7 K/uL   Lymphocytes Relative 32  12 - 46 %   Lymphs Abs 2.4  0.7 - 4.0 K/uL   Monocytes Relative 13 (*) 3 - 12 %   Monocytes Absolute 1.0  0.1 - 1.0 K/uL   Eosinophils Relative 2  0 - 5 %   Eosinophils Absolute 0.2  0.0 - 0.7 K/uL   Basophils Relative 0  0 - 1 %   Basophils Absolute 0.0  0.0 - 0.1 K/uL   Comment: Performed at [facility]    Physical Findings: AIMS: Facial and Oral Movements Muscles of Facial Expression: None, normal Lips and Perioral Area: None, normal Jaw: None, normal Tongue: None, normal,Extremity Movements Upper (arms, wrists, hands, fingers): None, normal Lower (legs, knees, ankles, toes): None, normal, Trunk Movements Neck, shoulders, hips: None, normal, Overall Severity Severity of abnormal movements (highest score from questions above): None, normal Incapacitation due to abnormal movements: None, normal Patient's awareness of abnormal movements (rate only patient's report): No Awareness,    CIWA:    COWS:     Psychiatric Specialty Exam: See Psychiatric Specialty Exam and Suicide Risk Assessment completed by Attending Physician prior to discharge.  Discharge destination:  Home  Is patient on multiple antipsychotic therapies at discharge:  No   Has Patient had three or more failed trials of antipsychotic monotherapy by history:  No  Recommended Plan for Multiple Antipsychotic Therapies: NA     Medication List    STOP taking these medications       buPROPion 150 MG 12 hr tablet  Commonly known as:  WELLBUTRIN SR     OLANZapine 20 MG tablet  Commonly known as:  ZYPREXA      TAKE these medications      Indication   albuterol 108 (90 BASE) MCG/ACT inhaler  Commonly known as:  PROVENTIL HFA;VENTOLIN HFA  Inhale 2 puffs into the lungs every 6 (six) hours as needed for wheezing or shortness of breath.      benztropine 1 MG tablet  Commonly known as:  COGENTIN  Take 1 tablet (1 mg total) by mouth at bedtime.   Indication:  Extrapyramidal Reaction caused by Medications     citalopram 10 MG tablet  Commonly known as:  CELEXA  Take 1 tablet (10 mg total) by mouth daily.   Indication:  mood stabilization     hydrOXYzine 25 MG tablet  Commonly known as:  ATARAX/VISTARIL  Take 1 tablet (25 mg total) by mouth every 6 (six) hours as needed for anxiety.   Indication:  anxiety     risperiDONE 2 MG disintegrating tablet  Commonly known as:  RISPERDAL M-TABS  Take 1 tablet (2 mg total) by mouth at bedtime.   Indication:  mood stabilization     risperiDONE microspheres 25 MG injection  Commonly known as:  RISPERDAL CONSTA  Inject 2 mLs (25 mg total) into the muscle every 14 (fourteen) days.  Start taking on:  09/02/2013   Indication:  schizoaffective disorder     traZODone 100 MG tablet  Commonly known as:  DESYREL  Take 1 tablet (100 mg total) by mouth at bedtime.   Indication:  Trouble Sleeping           Follow-up Information   Follow up with [facility]. (Go to the walk-in clinic M-F between 8 and 9AM for your hospital follow up appointment)    Contact information:   [address]  [place]  [phone]] [phone]      Follow-up recommendations:  Activity:  As tolerated Diet:  Heart healthy with low sodium.  Comments:  Take all medications as prescribed. Keep all follow-up appointments as scheduled.  Do not consume alcohol or use illegal drugs while on prescription medications. Report any adverse effects from your medications to your primary care provider promptly.  In the event of recurrent symptoms or worsening symptoms, call 911, a crisis hotline, or go to the nearest emergency  department for evaluation.   Total Discharge Time:  Greater than 30 minutes.  Signed: Beau FannyWithrow, John C, FNP-BC 08/24/2013, 12:05 PM  Patient seen, evaluated and I agree with notes by Nurse Practitioner. Thedore MinsMojeed Cassie Shedlock, MD

## 2013-08-24 NOTE — Tx Team (Signed)
  Interdisciplinary Treatment Plan Update   Date Reviewed:  08/24/2013  Time Reviewed:  12:24 PM  Progress in Treatment:   Attending groups: Yes Participating in groups: Yes Taking medication as prescribed: Yes  Tolerating medication: Yes Family/Significant other contact made: Yes  Patient understands diagnosis: Yes  Discussing patient identified problems/goals with staff: Yes Medical problems stabilized or resolved: Yes Denies suicidal/homicidal ideation: Yes Patient has not harmed self or others: Yes  For review of initial/current patient goals, please see plan of care.  Estimated Length of Stay:  D/C today  Reason for Continuation of Hospitalization:   New Problems/Goals identified:  N/A  Discharge Plan or Barriers:   return home, follow up outpt  Additional Comments:  Pt not d/ced on Friday as planned as he was given an injection on Thursday without having prior oral med., and needed to be observed for possible side affects  Attendees:  Signature: Thedore MinsMojeed Akintayo, MD 08/24/2013 12:24 PM   Signature: Richelle Itood Maizee Reinhold, LCSW 08/24/2013 12:24 PM  Signature: Fransisca KaufmannLaura Davis, NP 08/24/2013 12:24 PM  Signature: Joslyn Devonaroline Beaudry, RN 08/24/2013 12:24 PM  Signature: Liborio NixonPatrice White, RN 08/24/2013 12:24 PM  Signature:  08/24/2013 12:24 PM  Signature:   08/24/2013 12:24 PM  Signature:    Signature:    Signature:    Signature:    Signature:    Signature:      Scribe for Treatment Team:   Richelle Itood Narvel Kozub, LCSW  08/24/2013 12:24 PM

## 2013-08-24 NOTE — Progress Notes (Signed)
Patient ID: Darren [name], male   DOB: 1975/09/04, 38 y.o.   MRN: [id number] He has been  Up and on the phone and took his medications. He denies having  A/V hallucinations. Has been in bed awake remainder of time.

## 2013-08-28 ENCOUNTER — Encounter (HOSPITAL_COMMUNITY): Payer: Self-pay | Admitting: Emergency Medicine

## 2013-08-31 NOTE — Progress Notes (Signed)
Patient Discharge Instructions:  After Visit Summary (AVS):   Faxed to:  08/31/13 Discharge Summary Note:   Faxed to:  08/31/13 Psychiatric Admission Assessment Note:   Faxed to:  08/31/13 Suicide Risk Assessment - Discharge Assessment:   Faxed to:  08/31/13 Faxed/Sent to the Next Level Care provider:  08/31/13 [facility] @ [phone]  Jerelene ReddenSheena E [place], 08/31/2013, 3:02 PM

## 2013-10-11 ENCOUNTER — Emergency Department (HOSPITAL_COMMUNITY): Payer: Medicaid Other

## 2013-10-11 ENCOUNTER — Emergency Department (EMERGENCY_DEPARTMENT_HOSPITAL)
Admission: EM | Admit: 2013-10-11 | Discharge: 2013-10-13 | Disposition: A | Discharge status: Other Acute Inpatient Hospital | Payer: Medicaid Other | Source: Home / Self Care | Attending: Emergency Medicine | Admitting: Emergency Medicine

## 2013-10-11 ENCOUNTER — Encounter (HOSPITAL_COMMUNITY): Payer: Self-pay | Admitting: Emergency Medicine

## 2013-10-11 DIAGNOSIS — X818XXA Intentional self-harm by jumping or lying in front of other moving object, initial encounter: Secondary | ICD-10-CM | POA: Insufficient documentation

## 2013-10-11 DIAGNOSIS — Z880 Allergy status to penicillin: Secondary | ICD-10-CM | POA: Insufficient documentation

## 2013-10-11 DIAGNOSIS — Z8659 Personal history of other mental and behavioral disorders: Secondary | ICD-10-CM | POA: Insufficient documentation

## 2013-10-11 DIAGNOSIS — Z87891 Personal history of nicotine dependence: Secondary | ICD-10-CM

## 2013-10-11 DIAGNOSIS — F200 Paranoid schizophrenia: Secondary | ICD-10-CM

## 2013-10-11 DIAGNOSIS — S8990XA Unspecified injury of unspecified lower leg, initial encounter: Secondary | ICD-10-CM

## 2013-10-11 DIAGNOSIS — R443 Hallucinations, unspecified: Secondary | ICD-10-CM

## 2013-10-11 DIAGNOSIS — S99919A Unspecified injury of unspecified ankle, initial encounter: Secondary | ICD-10-CM

## 2013-10-11 DIAGNOSIS — J45909 Unspecified asthma, uncomplicated: Secondary | ICD-10-CM | POA: Insufficient documentation

## 2013-10-11 DIAGNOSIS — Z79899 Other long term (current) drug therapy: Secondary | ICD-10-CM

## 2013-10-11 DIAGNOSIS — R45851 Suicidal ideations: Secondary | ICD-10-CM

## 2013-10-11 DIAGNOSIS — R4689 Other symptoms and signs involving appearance and behavior: Principal | ICD-10-CM

## 2013-10-11 DIAGNOSIS — R4589 Other symptoms and signs involving emotional state: Secondary | ICD-10-CM

## 2013-10-11 DIAGNOSIS — S99929A Unspecified injury of unspecified foot, initial encounter: Secondary | ICD-10-CM

## 2013-10-11 LAB — CBC
HCT: 42.3 % (ref 39.0–52.0)
HEMOGLOBIN: 14.4 g/dL (ref 13.0–17.0)
MCH: 28.3 pg (ref 26.0–34.0)
MCHC: 34 g/dL (ref 30.0–36.0)
MCV: 83.3 fL (ref 78.0–100.0)
Platelets: 151 10*3/uL (ref 150–400)
RBC: 5.08 MIL/uL (ref 4.22–5.81)
RDW: 13.4 % (ref 11.5–15.5)
WBC: 7.1 10*3/uL (ref 4.0–10.5)

## 2013-10-11 LAB — ACETAMINOPHEN LEVEL: Acetaminophen (Tylenol), Serum: <15 ug/mL (ref 10–30)

## 2013-10-11 LAB — COMPREHENSIVE METABOLIC PANEL
ALT: 17 U/L (ref 0–53)
ANION GAP: 17 — AB (ref 5–15)
AST: 23 U/L (ref 0–37)
Albumin: 4.1 g/dL (ref 3.5–5.2)
Alkaline Phosphatase: 49 U/L (ref 39–117)
BUN: 13 mg/dL (ref 6–23)
CO2: 23 meq/L (ref 19–32)
CREATININE: 1.09 mg/dL (ref 0.50–1.35)
Calcium: 9.4 mg/dL (ref 8.4–10.5)
Chloride: 102 mEq/L (ref 96–112)
GFR, EST NON AFRICAN AMERICAN: 86 mL/min — AB (ref >90)
Glucose, Bld: 110 mg/dL — ABNORMAL HIGH (ref 70–99)
Potassium: 4 mEq/L (ref 3.7–5.3)
Sodium: 142 mEq/L (ref 137–147)
Total Bilirubin: 0.2 mg/dL — ABNORMAL LOW (ref 0.3–1.2)
Total Protein: 7.3 g/dL (ref 6.0–8.3)

## 2013-10-11 LAB — RAPID URINE DRUG SCREEN, HOSP PERFORMED
AMPHETAMINES: NOT DETECTED
Barbiturates: NOT DETECTED
Benzodiazepines: NOT DETECTED
Cocaine: NOT DETECTED
Opiates: NOT DETECTED
Tetrahydrocannabinol: NOT DETECTED

## 2013-10-11 LAB — ETHANOL: Alcohol, Ethyl (B): <11 mg/dL (ref 0–11)

## 2013-10-11 LAB — SALICYLATE LEVEL: Salicylate Lvl: <2 mg/dL — ABNORMAL LOW (ref 2.8–20.0)

## 2013-10-11 MED ORDER — ACETAMINOPHEN 325 MG PO TABS
650.0000 mg | ORAL_TABLET | Freq: Four times a day (QID) | ORAL | Status: DC | PRN
  Administered 2013-10-11: 650 mg via ORAL
  Filled 2013-10-11: qty 2

## 2013-10-11 NOTE — ED Notes (Signed)
Pt states he is suicidal. Pt states he jumped off a second floor balcony tonight. Denies injury, no injuries noted. Pt also states plan would be to take a lot of pills. Pt denies HI, pt is alert, calm and cooperative. Pt also reports audible hallucinations.

## 2013-10-11 NOTE — BH Assessment (Signed)
[facility] Assessment Progress Note      Unable to connect via telepsych machine despite multiple attempts and requests to have call initiated via cart 1 at [facility].  Made critical ticket report to IT.  One hour later error was resolved on [facility] end.  Requested RN make critical ticket report for the [facility] Cart 1.  Was able to connect via cart two in the [facility] ED.  Cart was relocated in order to complete assessment until cart 1 can be repaired.

## 2013-10-11 NOTE — ED Notes (Signed)
Pt dinner tray ordered.

## 2013-10-11 NOTE — BH Assessment (Signed)
Assessment Note  Darren [name] is an 38 y.o. male who presents to [facility] after making a suicide attempt by jumping off a balcony.  He reports that things are very stressful at home, but declines to say more about it because "it's too depressing".  He does report that there was some event this morning that precipitated his need to attempt to kill himself.  He says that he was last admitted to [facility] in June or July of 2015 after attempting to kill himself by cutting his wrists he reports that was going to [place], but has not been there in several months due to transportation issues.  Darren [name] reports feelings of worthlessness, anger, irritability, crying spells, insomnia, decreased appetite with 15 lb weight loss, isolating behavior, and an anxiety level of 10.  He does admit to a history of panic attacks, but states he has not had one in a year.  His present anxiety is evident by his shallow breathing and tense stature.  He denies any current HI or SA. He endorses auditory hallucinations including mumers, which he cannot understand, visual hallucinations specifically seeing "little midgets running around" and feelings that people are out to get him.    Consulted with Claudette Head, [facility] NP, who agrees the patient is appropriate for inpatient treatment.  Notified EDP Darren [name], will continue to see placement.  [facility] at capacity.   Axis I: Major Depression, Recurrent severe and with psychotic features Axis II: Deferred Axis III:  Past Medical History  Diagnosis Date  . Paranoid schizophrenia   . Schizophrenia   . Bipolar disorder (manic depression)   . Asthma    Axis IV: problems with primary support group Axis V: 31-40 impairment in reality testing  Past Medical History:  Past Medical History  Diagnosis Date  . Paranoid schizophrenia   . Schizophrenia   . Bipolar disorder (manic depression)   . Asthma     Past Surgical History  Procedure Laterality Date  . Hernia repair      Family History:  History reviewed. No pertinent family history.  Social History:  reports that he has quit smoking. He has never used smokeless tobacco. He reports that he uses illicit drugs (Cocaine). He reports that he does not drink alcohol.  Additional Social History:  Alcohol / Drug Use History of alcohol / drug use?: No history of alcohol / drug abuse  CIWA: CIWA-Ar BP: 105/70 mmHg Pulse Rate: 97 COWS:    Allergies:  Allergies  Allergen Reactions  . Penicillins Anaphylaxis  . Shrimp [Shellfish Allergy] Anaphylaxis  . Penicillins Hives and Itching    Home Medications:  (Not in a hospital admission)  OB/GYN Status:  No LMP for male patient.  General Assessment Data Location of Assessment: [facility] ED Is this a Tele or Face-to-Face Assessment?: Face-to-Face Is this an Initial Assessment or a Re-assessment for this encounter?: Initial Assessment Living Arrangements: Non-relatives/Friends Can pt return to current living arrangement?: Yes Admission Status: Voluntary Is patient capable of signing voluntary admission?: Yes Transfer from: Acute Hospital     [facility] Crisis Care Plan Living Arrangements: Non-relatives/Friends Name of Psychiatrist: [facility] Name of Therapist: [facility]  Education Status Is patient currently in school?: No Highest grade of school patient has completed: 12  Risk to self with the past 6 months Suicidal Ideation: Yes-Currently Present Suicidal Intent: Yes-Currently Present Is patient at risk for suicide?: Yes Suicidal Plan?: Yes-Currently Present Specify Current Suicidal Plan: jump off balcony Access to Means: Yes Specify Access to Suicidal Means: balcony  What has been your use of drugs/alcohol within the last 12 months?: n Previous Attempts/Gestures: Yes How many times?: 2 (belt, overdose) Triggers for Past Attempts:  (stress) Intentional Self Injurious Behavior: None Family Suicide History: Yes (sister made attempt) Recent stressful life event(s): Other  (Comment) (personal stress) Persecutory voices/beliefs?: Yes Depression: Yes Depression Symptoms: Feeling worthless/self pity;Feeling angry/irritable;Loss of interest in usual pleasures;Guilt;Tearfulness;Insomnia;Isolating;Fatigue;Despondent Substance abuse history and/or treatment for substance abuse?: No Suicide prevention information given to non-admitted patients: Not applicable  Risk to Others within the past 6 months Homicidal Ideation: No Thoughts of Harm to Others: No Current Homicidal Intent: No Current Homicidal Plan: No Access to Homicidal Means: No History of harm to others?: No Violent Behavior Description: fighting with brother Does 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, museum/gallery) Criminal Charges Pending?: No Does patient have a court date: No  Psychosis Hallucinations: Auditory;Visual ("little midget people runnign around") Delusions: Persecutory  Mental Status Report Appear/Hygiene: Unremarkable;In scrubs Eye Contact: Good Motor Activity: Freedom of movement Speech: Logical/coherent Level of Consciousness: Alert Mood: Depressed;Anxious Affect: Appropriate to circumstance Anxiety Level: Severe Thought Processes: Coherent;Relevant Judgement: Unimpaired Orientation: Person;Place;Time;Situation Obsessive Compulsive Thoughts/Behaviors: None  Cognitive Functioning Concentration: Decreased Memory: Remote Intact;Recent Intact IQ: Average Insight: Fair Impulse Control: Poor Appetite: Poor Weight Loss: 15 Weight Gain: 0 Sleep: Decreased Total Hours of Sleep: 3 Vegetative Symptoms: Staying in bed;Decreased grooming  ADLScreening [facility] Assessment Services) Patient's cognitive ability adequate to safely complete daily activities?: Yes Patient able to express need for assistance with ADLs?: Yes Independently performs ADLs?: Yes (appropriate for developmental age)  Prior Inpatient Therapy Prior Inpatient Therapy: Yes Prior Therapy Dates: June  2015 Prior Therapy Facilty/Provider([facility]): [facility] Reason for Treatment: Depression, Suicide attempt  Prior Outpatient Therapy Prior Outpatient Therapy: Yes Prior Therapy Dates: summer 2015 Prior Therapy Facilty/Provider(s): [facility] Reason for Treatment: depression,   ADL Screening (condition at time of admission) Patient's cognitive ability adequate to safely complete daily activities?: Yes Patient able to express need for assistance with ADLs?: Yes Independently performs ADLs?: Yes (appropriate for developmental age)         Values / Beliefs Cultural Requests During Hospitalization: None Spiritual Requests During Hospitalization: None   Advance Directives (For Healthcare) Does patient have an advance directive?: No Would patient like information on creating an advanced directive?: No - patient declined information Nutrition Screen- [facility] Adult/[facility]/AP Patient's home diet: Regular  Additional Information 1:1 In Past 12 Months?: No CIRT Risk: No Elopement Risk: No Does patient have medical clearance?: Yes     Disposition:  Disposition Initial Assessment Completed for this Encounter: Yes Disposition of Patient: Inpatient treatment program Type of inpatient treatment program: Adult  On Site Evaluation by:   Reviewed with Physician:    Steward Ros 10/11/2013 10:45 AM

## 2013-10-11 NOTE — ED Notes (Signed)
Provided menu and fresh ice for ice pack on left ankle. Discussed to leave ice on for only 15-20 increments at a time with patient and sitter, Darren [name].

## 2013-10-11 NOTE — ED Notes (Signed)
Billie, sitter, present at the bedside.

## 2013-10-11 NOTE — ED Notes (Signed)
Pt unable to provide urine at this time

## 2013-10-11 NOTE — ED Notes (Signed)
Patient states he isn't doing well.  States he is not feeling well and wanted to hurt himself.  Attempted to hurt himself by jumping off the second story balcony.  C/o pain to his leg and left ankle

## 2013-10-11 NOTE — ED Provider Notes (Signed)
CSN: [id number]     Arrival date & time 10/11/13  2130 History   None    Chief Complaint  Patient presents with  . Suicidal     (Consider location/radiation/quality/duration/timing/severity/associated sxs/prior Treatment) HPI Darren [name] is a 38 y.o. male for evaluation of suicidal intent. Patient states earlier this evening he attempted to jump out of his second floor balcony in an attempt to kill himself. Patient reports that he has been depressed a lot but would "rather not speak on it" regarding motive for a suicide attempt. Patient reports a past medical history significant for bipolar, depression and paranoid schizophrenia. He states he normally takes Zyprexa, Lexapro and Wellbutrin but has not taken it in several months due to cost. He also reports seeing a counselor a [facility] but has not been for several months also due to cost. He also reports hallucinations saying "people are following me", he says that the people are saying things but they are just mumbling and not saying specific words. He reports rare alcohol use, no use of benzodiazepines, cocaine use one year ago and denies any other substance use. Patient reports left ankle pain but is able to walk around in his room.  Past Medical History  Diagnosis Date  . Paranoid schizophrenia   . Schizophrenia   . Bipolar disorder (manic depression)   . Asthma    Past Surgical History  Procedure Laterality Date  . Hernia repair     History reviewed. No pertinent family history. History  Substance Use Topics  . Smoking status: Former Games developer  . Smokeless tobacco: Never Used  . Alcohol Use: No     Comment: no cocaine use for 2 yrs     Review of Systems  Constitutional: Negative for fever.  HENT: Negative for sore throat.   Eyes: Negative for visual disturbance.  Respiratory: Negative for shortness of breath.   Cardiovascular: Negative for chest pain.  Gastrointestinal: Negative for abdominal pain.  Endocrine: Negative for  polyuria.  Genitourinary: Negative for dysuria.  Musculoskeletal:       Left ankle pain  Skin: Negative for rash.  Neurological: Negative for headaches.  Psychiatric/Behavioral: Positive for suicidal ideas and hallucinations.      Allergies  Penicillins; Shrimp; and Penicillins  Home Medications   Prior to Admission medications   Medication Sig Start Date End Date Taking? Authorizing Provider  albuterol (PROVENTIL HFA;VENTOLIN HFA) 108 (90 BASE) MCG/ACT inhaler Inhale into the lungs every 6 (six) hours as needed for wheezing or shortness of breath.    Historical Provider, MD   BP 101/62  Pulse 133  Temp(Src) 98.2 F (36.8 C) (Oral)  Resp 20  SpO2 96% Physical Exam  Nursing note and vitals reviewed. Constitutional: He is oriented to person, place, and time. He appears well-developed and well-nourished.  HENT:  Head: Normocephalic and atraumatic.  Mouth/Throat: Oropharynx is clear and moist.  Eyes: Conjunctivae are normal. Pupils are equal, round, and reactive to light. Right eye exhibits no discharge. Left eye exhibits no discharge. No scleral icterus.  Neck: Neck supple.  Cardiovascular: Normal rate, regular rhythm and normal heart sounds.   Pulmonary/Chest: Effort normal and breath sounds normal. No respiratory distress. He has no wheezes. He has no rales.  Abdominal: Soft. There is no tenderness.  Musculoskeletal:  Ankle tenderness over the left posterior lateral malleolus. No obvious deformities, lesions or wounds appreciated. Patient is able to ambulate in his room independently with a somewhat antalgic gait but without any appreciable ataxia. Distal pulses intact,  good perfusion.   Neurological: He is alert and oriented to person, place, and time.  Cranial Nerves II-XII grossly intact. No neurological deficits appreciated.  Skin: Skin is warm and dry. No rash noted.  Psychiatric: He has a normal mood and affect.    ED Course  Procedures (including critical care  time) Labs Review Labs Reviewed  COMPREHENSIVE METABOLIC PANEL - Abnormal; Notable for the following:    Glucose, Bld 110 (*)    Total Bilirubin 0.2 (*)    GFR calc non Af Amer 86 (*)    Anion gap 17 (*)    All other components within normal limits  SALICYLATE LEVEL - Abnormal; Notable for the following:    Salicylate Lvl <2.0 (*)    All other components within normal limits  ACETAMINOPHEN LEVEL  CBC  ETHANOL  URINE RAPID DRUG SCREEN (HOSP PERFORMED)    Imaging Review Dg Ankle Complete Left  10/11/2013   CLINICAL DATA:  Ankle injury with pain.  EXAM: LEFT ANKLE COMPLETE - 3+ VIEW  COMPARISON:  None.  FINDINGS: There is no evidence for fracture, subluxation or dislocation. Ankle mortise is preserved. No worrisome lytic or sclerotic osseous lesion. Overlying soft tissues are unremarkable.  IMPRESSION: Negative.   Electronically Signed   By: Kennith Center M.D.   On: 10/11/2013 07:17     EKG Interpretation None     Meds given in ED:  Medications - No data to display  New Prescriptions   No medications on file   Filed Vitals:   10/11/13 0420  BP: 101/62  Pulse: 133  Temp: 98.2 F (36.8 C)  Resp: 20    MDM  Vitals stable - WNL -afebrile Pt resting comfortably in ED.  PE no concern for fracture or dislocation or other serious injury Labwork is initially normal and noncontributory Imaging shows no evidence of ankle fracture or dislocation Patient is medically cleared at this time is awaiting bed placement  Prior to patient discharge, I discussed and reviewed this case with Dr.Otter      Final diagnoses:  None        Sharlene Motts, PA-C 10/11/13 0740

## 2013-10-12 DIAGNOSIS — Z91199 Patient's noncompliance with other medical treatment and regimen due to unspecified reason: Secondary | ICD-10-CM

## 2013-10-12 DIAGNOSIS — Z9119 Patient's noncompliance with other medical treatment and regimen: Secondary | ICD-10-CM

## 2013-10-12 DIAGNOSIS — F259 Schizoaffective disorder, unspecified: Secondary | ICD-10-CM

## 2013-10-12 MED ORDER — TRAZODONE HCL 50 MG PO TABS
50.0000 mg | ORAL_TABLET | Freq: Every day | ORAL | Status: DC
  Administered 2013-10-12: 50 mg via ORAL
  Filled 2013-10-12: qty 1

## 2013-10-12 MED ORDER — OLANZAPINE 5 MG PO TABS
10.0000 mg | ORAL_TABLET | Freq: Every day | ORAL | Status: DC
  Administered 2013-10-12: 10 mg via ORAL
  Filled 2013-10-12: qty 2

## 2013-10-12 NOTE — ED Notes (Signed)
DR JONNALAGADA IN TO SEE PT 

## 2013-10-12 NOTE — ED Notes (Signed)
[facility]. THEY ADVISE THAT THEY DO NOT ACCEPT ADULT MEDICAID

## 2013-10-12 NOTE — ED Provider Notes (Signed)
Medical screening examination/treatment/procedure(s) were performed by non-physician practitioner and as supervising physician I was immediately available for consultation/collaboration.   EKG Interpretation None       Olivia Mackielga M Jasmarie Coppock, MD 10/12/13 [phone]

## 2013-10-12 NOTE — Consult Note (Signed)
[facility] Consult   Reason for Consult:  Paranoid schizophrenia and suicidal attempt Referring Physician:  EDP Darren [name] is an 38 y.o. male. Total Time spent with patient: 45 minutes  Assessment: AXIS I:  Schizoaffective Disorder and non compliant with medication treatment. AXIS II:  Deferred AXIS III:   Past Medical History  Diagnosis Date  . Paranoid schizophrenia   . Schizophrenia   . Bipolar disorder (manic depression)   . Asthma    AXIS IV:  economic problems, occupational problems, other psychosocial or environmental problems, problems related to social environment and problems with primary support group AXIS V:  21-30 behavior considerably influenced by delusions or hallucinations OR serious impairment in judgment, communication OR inability to function in almost all areas  Plan:  Will start zyprexa 10 mg qd and trazodone 50 mg Qhs  Recommend psychiatric Inpatient admission when medically cleared. Supportive therapy provided about ongoing stressors. Appreciate psychiatric consultation Please contact [phone] if needs further assistance  Subjective:   Darren [name] is a 38 y.o. male patient admitted with Paranoid schizophrenia and suicidal attempt.  HPI:  Darren [name] is an 38 y.o. Male seen for face to face psychiatric evaluation of depression, psychosis and making a suicide attempt by jumping off a balcony. He reports that he has command auditory hallucination told him to jump out of balcony. He has been staying with his pastor and thir four children and his family staying out of state. He does report that there was some event this morning that precipitated his need to attempt to kill himself. He was last admitted to [facility] in June or July of 2015 after attempting to kill himself by cutting his wrists he reports that was going to [facility], but has not been there in several months and non compliant with medication treatment. Patient continues to be actively  hearing and seeing hallucinations and can not contract for safety. He is feelings of worthlessness, anger, irritability, crying spells, insomnia, decreased appetite with 15 lb weight loss, isolating behavior, and increased anxiety. He denies any current HI or SA. He endorses auditory hallucinations including mumers, which he cannot understand, visual hallucinations specifically seeing "little midgets running around" and feelings that people are out to get him.   HPI Elements: Location:  psychosis. Quality:  poor. Severity:  command hallucinatiomn. Timing:  unknown psychosocial stress and non compliance.  Past Psychiatric History: Past Medical History  Diagnosis Date  . Paranoid schizophrenia   . Schizophrenia   . Bipolar disorder (manic depression)   . Asthma     reports that he has quit smoking. He has never used smokeless tobacco. He reports that he uses illicit drugs (Cocaine). He reports that he does not drink alcohol. History reviewed. No pertinent family history. Family History Substance Abuse: Yes, Describe: (uncle) Family Supports: No Living Arrangements: Non-relatives/Friends Can pt return to current living arrangement?: Yes   Allergies:   Allergies  Allergen Reactions  . Penicillins Anaphylaxis  . Shrimp [Shellfish Allergy] Anaphylaxis  . Penicillins Hives and Itching    ACT Assessment Complete:  NO Objective: Blood pressure 104/62, pulse 79, temperature 97.6 F (36.4 C), temperature source Oral, resp. rate 20, SpO2 100.00%.There is no weight on file to calculate BMI. Results for orders placed during the hospital encounter of 10/11/13 (from the past 72 hour(s))  ACETAMINOPHEN LEVEL     Status: None   Collection Time    10/11/13  4:20 AM      Result Value Ref Range  Acetaminophen (Tylenol), Serum <15.0  10 - 30 ug/mL   Comment:            THERAPEUTIC CONCENTRATIONS VARY     SIGNIFICANTLY. A RANGE OF 10-30     ug/mL MAY BE AN EFFECTIVE     CONCENTRATION FOR MANY  PATIENTS.     HOWEVER, SOME ARE BEST TREATED     AT CONCENTRATIONS OUTSIDE THIS     RANGE.     ACETAMINOPHEN CONCENTRATIONS     >150 ug/mL AT 4 HOURS AFTER     INGESTION AND >50 ug/mL AT 12     HOURS AFTER INGESTION ARE     OFTEN ASSOCIATED WITH TOXIC     REACTIONS.  CBC     Status: None   Collection Time    10/11/13  4:20 AM      Result Value Ref Range   WBC 7.1  4.0 - 10.5 K/uL   RBC 5.08  4.22 - 5.81 MIL/uL   Hemoglobin 14.4  13.0 - 17.0 g/dL   HCT 42.3  39.0 - 52.0 %   MCV 83.3  78.0 - 100.0 fL   MCH 28.3  26.0 - 34.0 pg   MCHC 34.0  30.0 - 36.0 g/dL   RDW 13.4  11.5 - 15.5 %   Platelets 151  150 - 400 K/uL  COMPREHENSIVE METABOLIC PANEL     Status: Abnormal   Collection Time    10/11/13  4:20 AM      Result Value Ref Range   Sodium 142  137 - 147 mEq/L   Potassium 4.0  3.7 - 5.3 mEq/L   Chloride 102  96 - 112 mEq/L   CO2 23  19 - 32 mEq/L   Glucose, Bld 110 (*) 70 - 99 mg/dL   BUN 13  6 - 23 mg/dL   Creatinine, Ser 1.09  0.50 - 1.35 mg/dL   Calcium 9.4  8.4 - 10.5 mg/dL   Total Protein 7.3  6.0 - 8.3 g/dL   Albumin 4.1  3.5 - 5.2 g/dL   AST 23  0 - 37 U/L   ALT 17  0 - 53 U/L   Alkaline Phosphatase 49  39 - 117 U/L   Total Bilirubin 0.2 (*) 0.3 - 1.2 mg/dL   GFR calc non Af Amer 86 (*) >90 mL/min   GFR calc Af Amer >90  >90 mL/min   Comment: (NOTE)     The eGFR has been calculated using the CKD EPI equation.     This calculation has not been validated in all clinical situations.     eGFR's persistently <90 mL/min signify possible Chronic Kidney     Disease.   Anion gap 17 (*) 5 - 15  ETHANOL     Status: None   Collection Time    10/11/13  4:20 AM      Result Value Ref Range   Alcohol, Ethyl (B) <11  0 - 11 mg/dL   Comment:            LOWEST DETECTABLE LIMIT FOR     SERUM ALCOHOL IS 11 mg/dL     FOR MEDICAL PURPOSES ONLY  SALICYLATE LEVEL     Status: Abnormal   Collection Time    10/11/13  4:20 AM      Result Value Ref Range   Salicylate Lvl <3.4 (*)  2.8 - 20.0 mg/dL  URINE RAPID DRUG SCREEN (HOSP PERFORMED)     Status: None  Collection Time    10/11/13 12:00 PM      Result Value Ref Range   Opiates NONE DETECTED  NONE DETECTED   Cocaine NONE DETECTED  NONE DETECTED   Benzodiazepines NONE DETECTED  NONE DETECTED   Amphetamines NONE DETECTED  NONE DETECTED   Tetrahydrocannabinol NONE DETECTED  NONE DETECTED   Barbiturates NONE DETECTED  NONE DETECTED   Comment:            DRUG SCREEN FOR MEDICAL PURPOSES     ONLY.  IF CONFIRMATION IS NEEDED     FOR ANY PURPOSE, NOTIFY LAB     WITHIN 5 DAYS.                LOWEST DETECTABLE LIMITS     FOR URINE DRUG SCREEN     Drug Class       Cutoff (ng/mL)     Amphetamine      1000     Barbiturate      200     Benzodiazepine   174     Tricyclics       944     Opiates          300     Cocaine          300     THC              50   Labs are reviewed.  Current Facility-Administered Medications  Medication Dose Route Frequency Provider Last Rate Last Dose  . acetaminophen (TYLENOL) tablet 650 mg  650 mg Oral Q6H PRN Viona Gilmore Cartner, PA-C   650 mg at 10/11/13 1205  . OLANZapine (ZYPREXA) tablet 10 mg  10 mg Oral QHS Durward Parcel, MD      . traZODone (DESYREL) tablet 50 mg  50 mg Oral QHS Durward Parcel, MD       Current Outpatient Prescriptions  Medication Sig Dispense Refill  . albuterol (PROVENTIL HFA;VENTOLIN HFA) 108 (90 BASE) MCG/ACT inhaler Inhale into the lungs every 6 (six) hours as needed for wheezing or shortness of breath.        Psychiatric Specialty Exam: Physical Exam Full physical performed in Emergency Department. I have reviewed this assessment and concur with its findings.   Review of Systems  Musculoskeletal: Positive for myalgias.  Psychiatric/Behavioral: Positive for depression, suicidal ideas and hallucinations. The patient is nervous/anxious and has insomnia.     Blood pressure 104/62, pulse 79, temperature 97.6 F (36.4 C), temperature  source Oral, resp. rate 20, SpO2 100.00%.There is no weight on file to calculate BMI.  General Appearance: Guarded  Eye Contact::  Fair  Speech:  Slow  Volume:  Decreased  Mood:  Depressed  Affect:  Depressed and Flat  Thought Process:  Disorganized  Orientation:  Full (Time, Place, and Person)  Thought Content:  Hallucinations: Auditory, Paranoid Ideation and Rumination  Suicidal Thoughts:  Yes.  with intent/plan  Homicidal Thoughts:  No  Memory:  Immediate;   Fair Recent;   Fair  Judgement:  Impaired  Insight:  Lacking  Psychomotor Activity:  Psychomotor Retardation  Concentration:  Fair  Recall:  [place] of Knowledge:Fair  Language: Good  Akathisia:  NA  Handed:  Right  AIMS (if indicated):     Assets:  Communication Skills Desire for Improvement Housing Leisure Time Physical Health Resilience Social Support  Sleep:      Musculoskeletal: Strength & Muscle Tone: within normal limits Gait & Station: normal Patient  leans: N/A  Treatment Plan Summary: Daily contact with patient to assess and evaluate symptoms and progress in treatment Medication management Will start zyprexa 10 mg qd for psychosis and trazodone 50 mg Qhs for insomnia Refer to case manager for placement  Darren [name],JANARDHAHA R. 10/12/2013 12:40 PM

## 2013-10-13 ENCOUNTER — Inpatient Hospital Stay (HOSPITAL_COMMUNITY)
Admission: AD | Admit: 2013-10-13 | Discharge: 2013-10-19 | DRG: 885 | Disposition: A | Discharge status: Home / Self Care | Payer: Medicaid Other | Source: Intra-hospital | Attending: Psychiatry | Admitting: Psychiatry

## 2013-10-13 ENCOUNTER — Encounter (HOSPITAL_COMMUNITY): Payer: Self-pay | Admitting: Behavioral Health

## 2013-10-13 DIAGNOSIS — Z8249 Family history of ischemic heart disease and other diseases of the circulatory system: Secondary | ICD-10-CM

## 2013-10-13 DIAGNOSIS — R45851 Suicidal ideations: Secondary | ICD-10-CM | POA: Diagnosis present

## 2013-10-13 DIAGNOSIS — Z62810 Personal history of physical and sexual abuse in childhood: Secondary | ICD-10-CM | POA: Diagnosis present

## 2013-10-13 DIAGNOSIS — J45909 Unspecified asthma, uncomplicated: Secondary | ICD-10-CM | POA: Diagnosis present

## 2013-10-13 DIAGNOSIS — Z91013 Allergy to seafood: Secondary | ICD-10-CM

## 2013-10-13 DIAGNOSIS — G4700 Insomnia, unspecified: Secondary | ICD-10-CM | POA: Diagnosis present

## 2013-10-13 DIAGNOSIS — Z9114 Patient's other noncompliance with medication regimen: Secondary | ICD-10-CM | POA: Diagnosis present

## 2013-10-13 DIAGNOSIS — F1421 Cocaine dependence, in remission: Secondary | ICD-10-CM

## 2013-10-13 DIAGNOSIS — Z833 Family history of diabetes mellitus: Secondary | ICD-10-CM | POA: Diagnosis not present

## 2013-10-13 DIAGNOSIS — F200 Paranoid schizophrenia: Secondary | ICD-10-CM | POA: Diagnosis present

## 2013-10-13 DIAGNOSIS — F319 Bipolar disorder, unspecified: Secondary | ICD-10-CM

## 2013-10-13 DIAGNOSIS — Z9119 Patient's noncompliance with other medical treatment and regimen: Secondary | ICD-10-CM | POA: Diagnosis present

## 2013-10-13 DIAGNOSIS — Z91199 Patient's noncompliance with other medical treatment and regimen due to unspecified reason: Secondary | ICD-10-CM

## 2013-10-13 DIAGNOSIS — F250 Schizoaffective disorder, bipolar type: Principal | ICD-10-CM | POA: Diagnosis present

## 2013-10-13 DIAGNOSIS — Z87891 Personal history of nicotine dependence: Secondary | ICD-10-CM

## 2013-10-13 DIAGNOSIS — F259 Schizoaffective disorder, unspecified: Secondary | ICD-10-CM

## 2013-10-13 MED ORDER — ENSURE COMPLETE PO LIQD: 237.0000 mL | Freq: Two times a day (BID) | ORAL | Status: DC | PRN

## 2013-10-13 MED ORDER — OLANZAPINE 10 MG PO TABS
10.0000 mg | ORAL_TABLET | Freq: Every day | ORAL | Status: DC
  Administered 2013-10-13: 10 mg via ORAL
  Filled 2013-10-13 (×3): qty 1

## 2013-10-13 MED ORDER — BENZTROPINE MESYLATE 0.5 MG PO TABS
0.5000 mg | ORAL_TABLET | Freq: Two times a day (BID) | ORAL | Status: DC
  Administered 2013-10-13 – 2013-10-19 (×13): 0.5 mg via ORAL
  Filled 2013-10-13 (×15): qty 1
  Filled 2013-10-13: qty 6
  Filled 2013-10-13: qty 1
  Filled 2013-10-13: qty 6
  Filled 2013-10-13: qty 1

## 2013-10-13 MED ORDER — LITHIUM CARBONATE 300 MG PO CAPS
300.0000 mg | ORAL_CAPSULE | Freq: Two times a day (BID) | ORAL | Status: DC
  Administered 2013-10-13 – 2013-10-18 (×10): 300 mg via ORAL
  Filled 2013-10-13 (×13): qty 1

## 2013-10-13 MED ORDER — ACETAMINOPHEN 325 MG PO TABS
650.0000 mg | ORAL_TABLET | Freq: Four times a day (QID) | ORAL | Status: DC | PRN
  Administered 2013-10-16 – 2013-10-18 (×3): 650 mg via ORAL
  Filled 2013-10-13 (×3): qty 2

## 2013-10-13 MED ORDER — TRAZODONE HCL 50 MG PO TABS
50.0000 mg | ORAL_TABLET | Freq: Every evening | ORAL | Status: DC | PRN
  Administered 2013-10-13 – 2013-10-18 (×6): 50 mg via ORAL
  Filled 2013-10-13 (×6): qty 1
  Filled 2013-10-13: qty 6
  Filled 2013-10-13 (×3): qty 1
  Filled 2013-10-13: qty 6
  Filled 2013-10-13 (×6): qty 1

## 2013-10-13 MED ORDER — MAGNESIUM HYDROXIDE 400 MG/5ML PO SUSP: 30.0000 mL | Freq: Every day | ORAL | Status: DC | PRN

## 2013-10-13 MED ORDER — HALOPERIDOL 5 MG PO TABS
5.0000 mg | ORAL_TABLET | Freq: Two times a day (BID) | ORAL | Status: DC
  Administered 2013-10-13 – 2013-10-14 (×3): 5 mg via ORAL
  Filled 2013-10-13 (×6): qty 1

## 2013-10-13 MED ORDER — HYDROXYZINE HCL 25 MG PO TABS
25.0000 mg | ORAL_TABLET | Freq: Four times a day (QID) | ORAL | Status: DC | PRN
  Administered 2013-10-15 – 2013-10-16 (×2): 25 mg via ORAL
  Filled 2013-10-13 (×2): qty 1

## 2013-10-13 MED ORDER — ALUM & MAG HYDROXIDE-SIMETH 200-200-20 MG/5ML PO SUSP: 30.0000 mL | ORAL | Status: DC | PRN

## 2013-10-13 MED ORDER — ALBUTEROL SULFATE HFA 108 (90 BASE) MCG/ACT IN AERS: 1.0000 | INHALATION_SPRAY | Freq: Four times a day (QID) | RESPIRATORY_TRACT | Status: DC | PRN

## 2013-10-13 MED ORDER — LITHIUM CARBONATE 300 MG PO CAPS: 300.0000 mg | ORAL_CAPSULE | Freq: Two times a day (BID) | ORAL | Status: DC

## 2013-10-13 NOTE — Progress Notes (Signed)
D: Patient denies HI and also reports auditory hallucinations and reports that he hears voices telling him to harm himself; patient denies visual hallucinations  A: Monitored q 15 minutes; patient encouraged to attend groups; patient educated about medications; patient given medications per physician orders; patient encouraged to express feelings and/or concerns  R: Patient is cooperative and pleasant; patient is flat and sad; patient's interaction with staff and peers is minimal except when talking with staff members; patient was able to set goal to talk with staff 1:1 when having feelings of SI; patient is taking medications as prescribed and tolerating medications; patient is not attending any groups and has been in the bed all day

## 2013-10-13 NOTE — [facility] Suicide Risk Assessment (Signed)
[facility] INPATIENT:  Family/Significant Other Suicide Prevention Education  Suicide Prevention Education:  Education Completed; Darren [name] [phone], has been identified by the patient as the family member/significant other with whom the patient will be residing, and identified as the person(s) who will aid the patient in the event of a mental health crisis (suicidal ideations/suicide attempt).  With written consent from the patient, the family member/significant other has been provided the following suicide prevention education, prior to the and/or following the discharge of the patient.  The suicide prevention education provided includes the following:  Suicide risk factors  Suicide prevention and interventions  National Suicide Hotline telephone number  [facility] telephone number  [facility] Emergency Assistance 911  [facility] and/or Residential Mobile Crisis Unit telephone number  Request made of family/significant other to:  Remove weapons (e.g., guns, rifles, knives), all items previously/currently identified as safety concern.    Remove drugs/medications (over-the-counter, prescriptions, illicit drugs), all items previously/currently identified as a safety concern.  The family member/significant other verbalizes understanding of the suicide prevention education information provided.  The family member/significant other agrees to remove the items of safety concern listed above.   Darren [name] 10/13/2013, 3:27 PM

## 2013-10-13 NOTE — Progress Notes (Signed)
NUTRITION ASSESSMENT  Pt identified as at risk on the Malnutrition Screen Tool  INTERVENTION: 1. Educated patient on the importance of nutrition and encouraged intake of food and beverages. 2. Discussed weight goals. 3. Supplements: Ensure Complete po BID, each supplement provides 350 kcal and 13 grams of protein pnr   NUTRITION DIAGNOSIS: Unintentional weight loss related to sub-optimal intake as evidenced by pt report.   Goal: Pt to meet >/= 90% of their estimated nutrition needs.  Monitor:  PO intake  Assessment:  Patient readmitted with schizoaffective disorder.  Patient reports that appetite has been poor "for a while" and that his UBW is 200 lbs "maybe 6 months ago".  Per e-chart, patient's weight has increased 3 lbs in the past 2 months.  38 y.o. male  Height: Ht Readings from Last 1 Encounters:  10/13/13 5' 5.5" (1.664 m)    Weight: Wt Readings from Last 1 Encounters:  10/13/13 157 lb (71.215 kg)    Weight Hx: Wt Readings from Last 10 Encounters:  10/13/13 157 lb (71.215 kg)  08/11/13 154 lb (69.854 kg)  05/30/13 151 lb (68.493 kg)  05/09/13 155 lb 6 oz (70.478 kg)  04/03/13 144 lb 8 oz (65.545 kg)  02/09/13 139 lb (63.05 kg)  02/07/13 139 lb 1 oz (63.078 kg)  01/21/13 142 lb 3 oz (64.496 kg)  07/21/12 140 lb (63.504 kg)    BMI:  Body mass index is 25.72 kg/(m^2). Pt meets criteria for overweight based on current BMI.  Estimated Nutritional Needs: Kcal: 25-30 kcal/kg Protein: > 1 gram protein/kg Fluid: 1 ml/kcal  Diet Order: General Pt is also offered choice of unit snacks mid-morning and mid-afternoon.  Pt is eating as desired.   Lab results and medications reviewed.   Oran ReinLaura Tabita Corbo, RD, LDN Clinical Inpatient Dietitian Pager:  [phone] Weekend and after hours pager:  [phone]

## 2013-10-13 NOTE — Progress Notes (Signed)
The focus of this group is to help patients review their daily goal of treatment and discuss progress on daily workbooks. Pt attended the evening group session and responded to all discussion prompts from the Writer. Pt shared that today was a good day on the unit, the highlight of which was a phone call from a friend. "I just generally felt good today." Pt's only additional need from Nursing Staff this evening was for towels, which were given to him following wrap-up. Pt told the group that upon discharge he planned on focusing on taking his meds and hanging out with a more positive crowd of people.

## 2013-10-13 NOTE — Progress Notes (Signed)
Patient ID: Darren [name], male   DOB: July 20, 1975, 38 y.o.   MRN: [id number] D: Patient in room sleeping on approach. Pt reports sleeping most of the day. Pt mood/affect appeared depressed and flat. Pt endorses passive suicidal ideation, auditory and visual hallucination and verbally contract to come to staff. Pt denies homicidal ideation intent and plan. No acute distressed noted at this time.   A: Met with pt 1:1.  Writer encouraged pt to discuss feelings and engage in unit activites. Pt encouraged to come to staff with any questions or concerns.   R: Patient is safe on the unit. Continue current POC.
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
## 2013-10-13 NOTE — [facility] Suicide Risk Assessment (Signed)
   Nursing information obtained from:  Patient Demographic factors:  Male;Divorced or widowed;Unemployed;Low socioeconomic status Current Mental Status:  Suicidal ideation indicated by patient;Self-harm thoughts;Self-harm behaviors;Intention to act on suicide plan Loss Factors:  NA Historical Factors:  Prior suicide attempts;Family history of mental illness or substance abuse Risk Reduction Factors:  Sense of responsibility to family;Religious beliefs about death;Living with another person, especially a relative;Positive social support Total Time spent with patient: 20 minutes  CLINICAL FACTORS:   Bipolar Disorder:   Depressive phase  Psychiatric Specialty Exam: Physical Exam Please see H&P.   ROS Please see H&P.   Blood pressure 117/73, pulse 90, temperature 97.7 F (36.5 C), temperature source Oral, resp. rate 18, height 5' 5.5" (1.664 m), weight 71.215 kg (157 lb), SpO2 95.00%.Body mass index is 25.72 kg/(m^2).  Please see H&P for MSE.     COGNITIVE FEATURES THAT CONTRIBUTE TO RISK:  Closed-mindedness Polarized thinking Thought constriction (tunnel vision)    SUICIDE RISK:   Moderate:  Frequent suicidal ideation with limited intensity, and duration, some specificity in terms of plans, no associated intent, good self-control, limited dysphoria/symptomatology, some risk factors present, and identifiable protective factors, including available and accessible social support.  PLAN OF CARE:Please see H&P.   I certify that inpatient services furnished can reasonably be expected to improve the patient's condition.  Joseph Johns MD 10/13/2013, 10:39 AM
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
## 2013-10-13 NOTE — [facility] Group Notes (Signed)
[facility] LCSW Group Therapy  10/13/2013 1:46 PM  Type of Therapy:  Group Therapy  Participation Level:  None  Participation Quality:  Drowsy  Affect:  Flat  Cognitive:  Lacking  Insight:  Limited  Engagement in Therapy:  Limited  Modes of Intervention:  Discussion, Education, Socialization and Support  Summary of Progress/Problems:Mental [employer] Association (MHA) speaker came to talk about his personal journey with substance abuse and mental illness. Group members were challenged to process ways by which to relate to the speaker. MHA speaker provided handouts and educational information pertaining to groups and services offered by the [facility].    Hyatt,Candace CSW Intern 10/13/2013, 1:46 PM

## 2013-10-13 NOTE — Tx Team (Signed)
Initial Interdisciplinary Treatment Plan   PATIENT STRESSORS: Marital or family conflict Medication change or noncompliance   PROBLEM LIST: Problem List/Patient Goals Date to be addressed Date deferred Reason deferred Estimated date of resolution  Medication noncompliance 10/13/2013     psychosis 10/13/2013     Suicidal ideations with an attempt by jumping off a second floor blacony 10/13/2013     Depression 10/13/2013                                    DISCHARGE CRITERIA:  Ability to meet basic life and health needs Adequate post-discharge living arrangements Motivation to continue treatment in a less acute level of care Verbal commitment to aftercare and medication compliance  PRELIMINARY DISCHARGE PLAN: Attend aftercare/continuing care group Outpatient therapy Return to previous living arrangement  PATIENT/FAMIILY INVOLVEMENT: This treatment plan has been presented to and reviewed with the patient, Darren [name].  The patient and family have been given the opportunity to ask questions and make suggestions.  Angeline SlimHill, Ashtan Girtman M 10/13/2013, 1:08 AM

## 2013-10-13 NOTE — [facility] Group Notes (Signed)
[facility]   10/13/2013 10:08 AM  Participation Quality:  Appropriate   Mood/Affect:  Depressed and Lethargic  Depression Rating:  8  Anxiety Rating:  8  Thoughts of Suicide:  Yes Will you contract for safety?   Yes  Current AVH:  Yes, pt reports AVH currently   Plan for Discharge/Comments:  Pt reports that he came to hospital due to SI and depression. Pt states taht he lives with his pastor who he identified as his only support. [place] [name] reported that he used to go to [facility] for med management but stopped going "some months back" when he couldn't find a ride. [name] has not been taking MH meds for a few months and reported increased AVH and depression/SI. Pt plans to return home and [facility].   Transportation Means: pastor   Supports: Education officer, environmentalpastor. She is also his payee.   Smart, American FinancialHeather LCSWA

## 2013-10-13 NOTE — Tx Team (Signed)
Interdisciplinary Treatment Plan Update (Adult)   Date: 10/13/2013   Time Reviewed: 10:30 AM  Progress in Treatment:  Attending groups: Yes Participating in groups: yes    Taking medication as prescribed: Yes  Tolerating medication: Yes  Family/Significant othe contact made: Not yet. CSW assessing.   Patient understands diagnosis: Yes, AEB seeking treatment for AVH, SI, Mood stabilization/depression, and med management.  Discussing patient identified problems/goals with staff: Yes  Medical problems stabilized or resolved: Yes  Denies suicidal/homicidal ideation: Pt reports passive SI but is able to contract for safety on unit.  Patient has not harmed self or Others: Yes  New problem(s) identified:  Discharge Plan or Barriers: Pt plans to return home with his paster/payee and follow-up [facility].  Additional comments:  Darren [name] is an 38 y.o. male [facility] making a suicide attempt by jumping off a balcony. He reports that things are very stressful at home, but declines to say more about it because "it's too depressing". He does report that there was some event this morning that precipitated his need to attempt to kill himself. He says that [facility] June [facility] July of 2015 after attempting to kill himself by cutting his wrists he reports that was going to [facility], but has not been there in several months due to transportation issues.  Darren [name] reports feelings of worthlessness, anger, irritability, crying spells, insomnia, decreased appetite with 15 lb weight loss, isolating behavior, and an anxiety level of 10. He does admit to a history of panic attacks, but states he has not had one in a year. His present anxiety is evident by his shallow breathing and tense stature. He denies any current HI or SA. He endorses auditory hallucinations including mumers, which he cannot understand, visual hallucinations specifically seeing "little midgets  running around" and feelings that people are out to get him.  Reason for Continuation of Hospitalization: SI AVH Depression/mood stabilization Medication Management Estimated length of stay: 5-7 days  For review of initial/current patient goals, please see plan of care.  Attendees:  Patient:    Family:    Physician: Geoffery LyonsIrving Lugo MD 10/13/2013 10:30 AM   Nursing: Foy Guadalajarahrista RN 10/13/2013 10:30 AM   Clinical Social Worker Itzae Miralles Smart, LCSWA  10/13/2013 10:30 AM   Other: Lowanda FosterBrittany RN 10/13/2013 10:30 AM   Other: Charleston Ropesandace Hyatt, CSW Intern 10/13/2013 10:30 AM   Other: Santa GeneraAnne Cunningham, LCSW 10/13/2013 10:30 AM   Other:    Scribe for Treatment Team:  Trula SladeHeather Smart LCSWA 10/13/2013 10:30 AM   Pt and CSW reviewed pt's identified goals and treatment plan. Pt verbalized understanding and agreed to treatment plan.

## 2013-10-14 DIAGNOSIS — F250 Schizoaffective disorder, bipolar type: Principal | ICD-10-CM

## 2013-10-14 LAB — LIPID PANEL
Cholesterol: 160 mg/dL (ref 0–200)
HDL: 47 mg/dL (ref >39)
LDL Cholesterol: 92 mg/dL (ref 0–99)
Total CHOL/HDL Ratio: 3.4 RATIO
Triglycerides: 103 mg/dL (ref <150)
VLDL: 21 mg/dL (ref 0–40)

## 2013-10-14 LAB — HEMOGLOBIN A1C
HEMOGLOBIN A1C: 5.7 % — AB (ref <5.7)
MEAN PLASMA GLUCOSE: 117 mg/dL — AB (ref <117)

## 2013-10-14 LAB — TSH: TSH: 1.62 u[IU]/mL (ref 0.350–4.500)

## 2013-10-14 MED ORDER — HALOPERIDOL 5 MG PO TABS
10.0000 mg | ORAL_TABLET | Freq: Two times a day (BID) | ORAL | Status: DC
  Administered 2013-10-14 – 2013-10-19 (×10): 10 mg via ORAL
  Filled 2013-10-14 (×6): qty 2
  Filled 2013-10-14: qty 12
  Filled 2013-10-14 (×6): qty 2
  Filled 2013-10-14: qty 12

## 2013-10-14 MED ORDER — ENSURE COMPLETE PO LIQD
237.0000 mL | Freq: Every day | ORAL | Status: DC
  Administered 2013-10-14 – 2013-10-18 (×5): 237 mL via ORAL

## 2013-10-14 NOTE — Progress Notes (Signed)
Nutrition Brief Note  Received nutritional consult. Full nutrition assessment completed yesterday. Met with pt who reports his appetite improving some and he has been drinking the Ensures. Pt had questions about weight gain. Discussed ways to consume more calories and provided "High calorie/protein nutrition therapy" handout from the Academy of Nutrition and Dietetics. Pt appreciative of RD visit.   Carlis Stable MS, [place], LDN [phone] Pager [phone] Weekend/After Hours Pager

## 2013-10-14 NOTE — Progress Notes (Signed)
[facility] MD Progress Note  10/14/2013 1:27 PM Darren [name]  MRN:  [id number] Subjective: Patient states, I am feeling a bit better ,but I still hear voices". Objective: Patient seen and chart reviewed. Patient continues to be depressed and anxious with some improvement. Patient continues to hear voices which is more like a background noise for him . Patient denies HI/VH/SI. Patient has been taking his medications ,denies side effects.  Diagnosis:  DSM5:  Primary psychiatric diagnosis:  Schizoaffective disorder,bipolar type ,multiple episodes ,currently in acute episode   Secondary psychiatric diagnosis:  Noncompliance with medication regimen  Stimulant (cocaine) use disorder in sustained remission   Nonpsychiatric diagnosis:  Asthma       Total Time spent with patient: 30 minutes   ADL's:  Intact  Sleep: Fair  Appetite:  Fair   Psychiatric Specialty Exam: Physical Exam  Constitutional: He is oriented to person, place, and time. He appears well-developed and well-nourished.  HENT:  Head: Normocephalic and atraumatic.  Neck: Normal range of motion.  GI: Soft.  Musculoskeletal: Normal range of motion.  Neurological: He is alert and oriented to person, place, and time.  Skin: Skin is warm.  Psychiatric: His speech is normal. His mood appears anxious. His affect is blunt. He is withdrawn. Thought content is paranoid. Cognition and memory are normal. He expresses impulsivity. He exhibits a depressed mood. He expresses no homicidal and no suicidal ideation.    Review of Systems  Constitutional: Negative.   HENT: Negative.   Eyes: Negative.   Respiratory: Negative.   Cardiovascular: Negative.   Gastrointestinal: Negative.   Genitourinary: Negative.   Musculoskeletal: Negative.   Skin: Negative.   Neurological: Negative.   Psychiatric/Behavioral: Positive for depression and hallucinations. Negative for suicidal ideas. The patient is not nervous/anxious.     Blood  pressure 98/74, pulse 64, temperature 97.7 F (36.5 C), temperature source Oral, resp. rate 19, height 5' 5.5" (1.664 m), weight 71.215 kg (157 lb), SpO2 95.00%.Body mass index is 25.72 kg/(m^2).  General Appearance: Casual  Eye Contact::  Fair  Speech:  Normal Rate  Volume:  Normal  Mood:  Anxious and Depressed  Affect:  Restricted  Thought Process:  Irrelevant  Orientation:  Full (Time, Place, and Person)  Thought Content:  Hallucinations: Auditory  Suicidal Thoughts:  No  Homicidal Thoughts:  No  Memory:  Immediate;   Fair Recent;   Fair Remote;   Fair  Judgement:  Impaired  Insight:  Lacking  Psychomotor Activity:  Normal  Concentration:  Fair  Recall:  [employer] of Knowledge:Fair  Language: Good  Akathisia:  No    AIMS (if indicated):   0  Assets:  Communication Skills  Sleep:  Number of Hours: 6.75   Musculoskeletal: Strength & Muscle Tone: within normal limits Gait & Station: normal Patient leans: N/A  Current Medications: Current Facility-Administered Medications  Medication Dose Route Frequency Provider Last Rate Last Dose  . acetaminophen (TYLENOL) tablet 650 mg  650 mg Oral Q6H PRN Kerry Hough, PA-C      . albuterol (PROVENTIL HFA;VENTOLIN HFA) 108 (90 BASE) MCG/ACT inhaler 1-2 puff  1-2 puff Inhalation Q6H PRN Kerry Hough, PA-C      . alum & mag hydroxide-simeth (MAALOX/MYLANTA) 200-200-20 MG/5ML suspension 30 mL  30 mL Oral Q4H PRN Kerry Hough, PA-C      . benztropine (COGENTIN) tablet 0.5 mg  0.5 mg Oral BID Jomarie Longs, MD   0.5 mg at 10/14/13 0816  . feeding supplement (ENSURE  COMPLETE) (ENSURE COMPLETE) liquid 237 mL  237 mL Oral BID PRN Jeoffrey MassedLaura Lee Jobe, RD      . haloperidol (HALDOL) tablet 10 mg  10 mg Oral BID Jomarie LongsSaramma Ronan Duecker, MD      . hydrOXYzine (ATARAX/VISTARIL) tablet 25 mg  25 mg Oral Q6H PRN Kerry HoughSpencer E Simon, PA-C      . lithium carbonate capsule 300 mg  300 mg Oral BID WC Jomarie LongsSaramma Brice Potteiger, MD   300 mg at 10/14/13 0816  . magnesium  hydroxide (MILK OF MAGNESIA) suspension 30 mL  30 mL Oral Daily PRN Kerry HoughSpencer E Simon, PA-C      . traZODone (DESYREL) tablet 50 mg  50 mg Oral QHS,MR X 1 Kerry HoughSpencer E Simon, PA-C   50 mg at 10/13/13 [id number]    Lab Results:  Results for orders placed during the hospital encounter of 10/13/13 (from the past 48 hour(s))  TSH     Status: None   Collection Time    10/14/13  6:15 AM      Result Value Ref Range   TSH 1.620  0.350 - 4.500 uIU/mL   Comment: [facility]  LIPID PANEL     Status: None   Collection Time    10/14/13  6:15 AM      Result Value Ref Range   Cholesterol 160  0 - 200 mg/dL   Triglycerides [id number]  <454<150 mg/dL   HDL 47  >09>39 mg/dL   Total CHOL/HDL Ratio 3.4     VLDL 21  0 - 40 mg/dL   LDL Cholesterol 92  0 - 99 mg/dL   Comment:            Total Cholesterol/HDL:CHD Risk     Coronary Heart Disease Risk Table                         Men   Women      1/2 Average Risk   3.4   3.3      Average Risk       5.0   4.4      2 X Average Risk   9.6   7.1      3 X Average Risk  23.4   11.0                Use the calculated Patient Ratio     above and the CHD Risk Table     to determine the patient's CHD Risk.                ATP III CLASSIFICATION (LDL):      <100     mg/dL   Optimal      [id number]  mg/dL   Near or Above                        Optimal      130-159  mg/dL   Borderline      [id number]  mg/dL   High      >213>190     mg/dL   Very High     Performed at [facility]  HEMOGLOBIN A1C     Status: Abnormal   Collection Time    10/14/13  6:15 AM      Result Value Ref Range   Hemoglobin A1C 5.7 (*) <5.7 %   Comment: (NOTE)  According to the ADA Clinical Practice Recommendations for 2011, when     HbA1c is used as a screening test:      >=6.5%   Diagnostic of Diabetes Mellitus               (if abnormal result is confirmed)     5.7-6.4%   Increased risk of developing Diabetes  [place]     References:Diagnosis and Classification of Diabetes Mellitus,Diabetes     Care,2011,34(Suppl 1):S62-S69 and Standards of Medical Care in             Diabetes - 2011,Diabetes Care,2011,34 (Suppl 1):S11-S61.   Mean Plasma Glucose 117 (*) <117 mg/dL   Comment: Performed at [facility]    Physical Findings: AIMS: Facial and Oral Movements Muscles of Facial Expression: None, normal Lips and Perioral Area: None, normal Jaw: None, normal Tongue: None, normal,Extremity Movements Upper (arms, wrists, hands, fingers): None, normal Lower (legs, knees, ankles, toes): None, normal, Trunk Movements Neck, shoulders, hips: None, normal, Overall Severity Severity of abnormal movements (highest score from questions above): None, normal Incapacitation due to abnormal movements: None, normal, Dental Status Current problems with teeth and/or dentures?: No Does patient usually wear dentures?: No  CIWA:    COWS:     Treatment Plan Summary: Daily contact with patient to assess and evaluate symptoms and progress in treatment Medication management  Plan: Treatment Plan/Recommendations:  Patient will benefit from inpatient treatment and stabilization.  Estimated length of stay is 5-7 days.  Reviewed past medical records,treatment plan.   Will increase Haldol to  10 mg po bid for psychosis. Collateral information was obtained from pastor with whom patient stays ,per her patient does well on LAI, Plan to discharge of Haldol decanoate.  Will continue cogentin for EPS.  Will continue Lithium 300 mg po bid for mood lability.Lithium level on 10/4.   Will continue trazodone as scheduled. TSH reviewed 10/13/13.    Will continue to monitor vitals ,medication compliance and treatment side effects while patient is here.  Will monitor for medical issues as well as call consult as needed.  CSW will start working on disposition.  Patient to participate in therapeutic milieu .            Medical Decision Making Problem Points:  Established problem, stable/improving (1) Data Points:  Order Aims Assessment (2) Review of medication regiment & side effects (2)  I certify that inpatient services furnished can reasonably be expected to improve the patient's condition.   Ceylin Dreibelbis MD 10/14/2013, 1:27 PM

## 2013-10-14 NOTE — [facility] Group Notes (Signed)
[facility] Group Notes:  (Nursing/MHT/Case Management/Adjunct)  Date:  10/14/2013  Time:  11:50 AM  Type of Therapy:  Nurse Education  Participation Level:  Minimal  Participation Quality:  Inattentive  Affect:  Flat  Cognitive:  Alert  Insight:  Limited  Engagement in Group:  Resistant  Modes of Intervention:  Discussion and Education  Summary of Progress/Problems: This group discusses leisure and lifestyle changes and how those can incorporate together to produce wellness for patients. This includes taking medication, self assessment, diet, and exercise.  Patient did not speak for most of the group. However, patient stated that a lifestyle change for him would to be changing his mood. Patient stated he would do this by taking his prescribed medications.  Lorraine Terriquez E 10/14/2013, 11:50 AM

## 2013-10-14 NOTE — Plan of Care (Signed)
Problem: Diagnosis: Increased Risk For Suicide Attempt Goal: STG-Patient Will Comply With Medication Regime Outcome: Progressing Patient is taking medications on time as prescribed.

## 2013-10-14 NOTE — Plan of Care (Signed)
Problem: Alteration in mood Goal: LTG-Patient reports reduction in suicidal thoughts (Patient reports reduction in suicidal thoughts and is able to verbalize a safety plan for whenever patient is feeling suicidal)  Outcome: Not Progressing Pt in bed most of the evening, report increased depression and suicidal ideation

## 2013-10-14 NOTE — Progress Notes (Signed)
Pt stated that he had a good day. He plans to continue to stay focused and on his medications. Also, follow up with his doctor.

## 2013-10-14 NOTE — Progress Notes (Signed)
Patient ID: Darren [name], male   DOB: 06/02/1975, 38 y.o.   MRN: [id number] D: Client visible in dayroom watching TV, reports medications are helping with voices. Client's goals: "Hope life get back on track" "encouraged to stay on my medication" A: Writer introduce self to client provided emotional support encouraged client to follow through with plans for mental stability. Staff will monitor q5015min for safety. R: Client is safe on the unit, attended group.

## 2013-10-14 NOTE — [facility] Group Notes (Signed)
[facility] LCSW Group Therapy  10/14/2013 2:35 PM  Type of Therapy:  Group Therapy  Participation Level:  Active  Participation Quality:  Attentive  Affect:  Appropriate  Cognitive:  Alert and Oriented  Insight:  Improving   Engagement in Therapy:  Engaged  Modes of Intervention:  Confrontation, Discussion, Education, Exploration, Problem-solving, Rapport Building, Socialization and Support  Summary of Progress/Problems: Emotion Regulation: This group focused on both positive and negative emotion identification and allowed group members to process ways to identify feelings, regulate negative emotions, and find healthy ways to manage internal/external emotions. Group members were asked to reflect on a time when their reaction to an emotion led to a negative outcome and explored how alternative responses using emotion regulation would have benefited them. Group members were also asked to discuss a time when emotion regulation was utilized when a negative emotion was experienced. Darren NeedleMichael was attentive and engaged during today's processing group. Darren NeedleMichael shared that he struggles with depression and was able to identify how depression affects him both mentally and physically. "I feel tired, sad, and hopeless." Darren NeedleMichael shows progress in the group setting and improving insight AEB his ability to identify triggers for his depression and ways to cope with depression in healthy ways. "Sometimes I just need an hour to myself to think things through and get over disappointments." Darren NeedleMichael shared that he can talk to his pastor who he lives with and who is a positive support for him.     Smart, [name] LCSWA  10/14/2013, 2:35 PM

## 2013-10-14 NOTE — Progress Notes (Signed)
Patient ID: Darren [name], male   DOB: 1975/03/04, 38 y.o.   MRN: [id number]  D: Pt. Denies SI/HI and visual Hallucinations. Patient reports auditory hallucinations, patient describes as "chatter." Patient reports that he slept "okay " last night. Patient reports energy is normal and concentration is good. Patient does not report any pain or discomfort at this time.   A: Support and encouragement provided to the patient to come to writer with any questions or concerns. Scheduled medications administered per physician's orders.  R: Patient is receptive and cooperative with Clinical research associatewriter but minimal and forwards very little. Patient is attending some groups. Q15 minute checks are maintained for safety.

## 2013-10-15 DIAGNOSIS — Z9114 Patient's other noncompliance with medication regimen: Secondary | ICD-10-CM

## 2013-10-15 MED ORDER — NICOTINE POLACRILEX 2 MG MT GUM: 2.0000 mg | CHEWING_GUM | OROMUCOSAL | Status: DC | PRN

## 2013-10-15 NOTE — Progress Notes (Signed)
Patient ID: Darren [name], male   DOB: 12-20-1975, 38 y.o.   MRN: [id number] D: Patient smiling on approach. Pt reports his day has been up and down. Pt rates depression  as 7 on a 0-10 scale. Pt reports mood is  brighter than previous days. Pt observed playing card with peers in dayroom. Pt denies SI/HI and pain. Pt reports AH seeing little people all around him. No acute distressed noted at this time.   A: Met with pt 1:1.  Writer encouraged pt to discuss feelings and engage in unit activites. Pt encouraged to come to staff with any questions or concerns.   R: Patient is safe on the unit. Continue current POC.

## 2013-10-15 NOTE — Progress Notes (Signed)
D:Pt rates his depression, hopelessness and anxiety as a 5 on 1-10 scale with 10 being the most. Pt reports auditory and visual hallucination. Pt would not describe hallucinations to writer and became guarded when asked about them.  A:Offered support, encouragement and 15 minute checks. R:Pt verbally denies si and hi. He contracts that he will alert staff if hallucinations increase or start to become overwhelming. Safety maintained on the unit.
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
## 2013-10-15 NOTE — Progress Notes (Signed)
[facility] Group Notes:  (Nursing/MHT/Case Management/Adjunct)  Date:  10/15/2013  Time:  10:00 PM  Type of Therapy:  Psychoeducational Skills  Participation Level:  Active  Participation Quality:  Appropriate  Affect:  Appropriate  Cognitive:  Lacking  Insight:  Limited  Engagement in Group:  Limited  Modes of Intervention:  Education  Summary of Progress/Problems: The patient had little to share except to say that he spoke with some of his friends today. In terms of the theme for the day, his relapse prevention will include staying on his medication which he admits is difficult for him.   Marq Rebello S 10/15/2013, 10:00 PM

## 2013-10-15 NOTE — [facility] Group Notes (Signed)
[facility]   10/15/2013 10:05 AM  Participation Quality:  DID NOT ATTEND-pt in room sleeping. Did not get up for group this morning.   Smart, American FinancialHeather LCSWA

## 2013-10-16 DIAGNOSIS — J45909 Unspecified asthma, uncomplicated: Secondary | ICD-10-CM

## 2013-10-16 DIAGNOSIS — Z87898 Personal history of other specified conditions: Secondary | ICD-10-CM

## 2013-10-16 MED ORDER — NICOTINE 7 MG/24HR TD PT24
7.0000 mg | MEDICATED_PATCH | Freq: Every day | TRANSDERMAL | Status: DC
  Administered 2013-10-17 – 2013-10-19 (×3): 7 mg via TRANSDERMAL
  Filled 2013-10-16 (×5): qty 1

## 2013-10-16 MED ORDER — NICOTINE 7 MG/24HR TD PT24
MEDICATED_PATCH | TRANSDERMAL | Status: AC
  Administered 2013-10-16: 7 mg
  Filled 2013-10-16: qty 1

## 2013-10-16 NOTE — Progress Notes (Signed)
Patient ID: Darren [name], male   DOB: Jan 14, 1976, 38 y.o.   MRN: [id number] [facility] MD Progress Note  10/16/2013 2:48 PM [name]  MRN:  [id number] Subjective: Patient reports: '' I am still hearing voices".  Objective: Patient seen and chart reviewed. He reports ongoing anxiety, psychosis, paranoia and depressive symptoms characterized by low energy level, poor concentration, lack of motivation and excessive sleep. Patient continues to endorse auditory hallucination in form of  hearing voices which is more like a background noise for him . Patient denies HI/VH/SI. He is compliant with her medication but partially compliant with the unit milieu.  Diagnosis:  DSM5:  Primary psychiatric diagnosis:  Schizoaffective disorder,bipolar type ,multiple episodes ,currently in acute episode   Secondary psychiatric diagnosis:  Noncompliance with medication regimen  Stimulant (cocaine) use disorder in sustained remission   Nonpsychiatric diagnosis:  Asthma    Total Time spent with patient: 25 minutes   ADL's:  Intact  Sleep: Fair  Appetite:  Fair   Psychiatric Specialty Exam: Physical Exam  Constitutional: He is oriented to person, place, and time. He appears well-developed and well-nourished.  HENT:  Head: Normocephalic and atraumatic.  Neck: Normal range of motion.  GI: Soft.  Musculoskeletal: Normal range of motion.  Neurological: He is alert and oriented to person, place, and time.  Skin: Skin is warm.  Psychiatric: His speech is normal. His mood appears anxious. His affect is blunt. He is withdrawn. Thought content is paranoid. Cognition and memory are normal. He expresses impulsivity. He exhibits a depressed mood. He expresses no homicidal and no suicidal ideation.    Review of Systems  Constitutional: Negative.   HENT: Negative.   Eyes: Negative.   Respiratory: Negative.   Cardiovascular: Negative.   Gastrointestinal: Negative.   Genitourinary: Negative.    Musculoskeletal: Negative.   Skin: Negative.   Neurological: Negative.   Psychiatric/Behavioral: Positive for depression and hallucinations. Negative for suicidal ideas. The patient is not nervous/anxious.     Blood pressure 90/59, pulse 138, temperature 97.5 F (36.4 C), temperature source Oral, resp. rate 18, height 5' 5.5" (1.664 m), weight 71.215 kg (157 lb), SpO2 95.00%.Body mass index is 25.72 kg/(m^2).  General Appearance: Casual  Eye Contact::  Fair  Speech:  Normal Rate  Volume:  Normal  Mood:  Anxious and Depressed  Affect:  Restricted  Thought Process:  Irrelevant  Orientation:  Full (Time, Place, and Person)  Thought Content:  Hallucinations: Auditory  Suicidal Thoughts:  No  Homicidal Thoughts:  No  Memory:  Immediate;   Fair Recent;   Fair Remote;   Fair  Judgement:  Impaired  Insight:  Lacking  Psychomotor Activity:  Normal  Concentration:  Fair  Recall:  [employer] of Knowledge:Fair  Language: Good  Akathisia:  No    AIMS (if indicated):   0  Assets:  Communication Skills  Sleep:  Number of Hours: 6   Musculoskeletal: Strength & Muscle Tone: within normal limits Gait & Station: normal Patient leans: N/A  Current Medications: Current Facility-Administered Medications  Medication Dose Route Frequency Provider Last Rate Last Dose  . acetaminophen (TYLENOL) tablet 650 mg  650 mg Oral Q6H PRN Kerry Hough, PA-C      . albuterol (PROVENTIL HFA;VENTOLIN HFA) 108 (90 BASE) MCG/ACT inhaler 1-2 puff  1-2 puff Inhalation Q6H PRN Kerry Hough, PA-C      . alum & mag hydroxide-simeth (MAALOX/MYLANTA) 200-200-20 MG/5ML suspension 30 mL  30 mL Oral Q4H PRN Kerry Hough, PA-C      .  benztropine (COGENTIN) tablet 0.5 mg  0.5 mg Oral BID Jomarie LongsSaramma Eappen, MD   0.5 mg at 10/16/13 0820  . feeding supplement (ENSURE COMPLETE) (ENSURE COMPLETE) liquid 237 mL  237 mL Oral Q1400 Tenny CrawHeather S Winkler, RD   237 mL at 10/15/13 1337  . haloperidol (HALDOL) tablet 10 mg  10 mg  Oral BID Jomarie LongsSaramma Eappen, MD   10 mg at 10/16/13 0820  . hydrOXYzine (ATARAX/VISTARIL) tablet 25 mg  25 mg Oral Q6H PRN Kerry HoughSpencer E Simon, PA-C   25 mg at 10/15/13 1901  . lithium carbonate capsule 300 mg  300 mg Oral BID WC Jomarie LongsSaramma Eappen, MD   300 mg at 10/16/13 0820  . magnesium hydroxide (MILK OF MAGNESIA) suspension 30 mL  30 mL Oral Daily PRN Kerry HoughSpencer E Simon, PA-C      . nicotine polacrilex (NICORETTE) gum 2 mg  2 mg Oral PRN Nehemiah MassedFernando Cobos, MD      . traZODone (DESYREL) tablet 50 mg  50 mg Oral QHS,MR X 1 Kerry HoughSpencer E Simon, PA-C   50 mg at 10/15/13 2235    Lab Results:  No results found for this or any previous visit (from the past 48 hour(s)).  Physical Findings: AIMS: Facial and Oral Movements Muscles of Facial Expression: None, normal Lips and Perioral Area: None, normal Jaw: None, normal Tongue: None, normal,Extremity Movements Upper (arms, wrists, hands, fingers): None, normal Lower (legs, knees, ankles, toes): None, normal, Trunk Movements Neck, shoulders, hips: None, normal, Overall Severity Severity of abnormal movements (highest score from questions above): None, normal Incapacitation due to abnormal movements: None, normal Patient's awareness of abnormal movements (rate only patient's report): No Awareness, Dental Status Current problems with teeth and/or dentures?: No Does patient usually wear dentures?: No  CIWA:    COWS:     Treatment Plan Summary: Daily contact with patient to assess and evaluate symptoms and progress in treatment Medication management  Plan: Treatment Plan/Recommendations:  Patient will benefit from inpatient treatment and stabilization.  Reviewed past medical records,treatment plan.   Will continue Haldol  10 mg po bid for psychosis. Collateral information was obtained from pastor with whom patient stays ,per her patient does well on LAI, Plan to discharge of Haldol decanoate.  Will continue cogentin for EPS.  Will continue Lithium 300 mg po  bid for mood lability.Lithium level on 10/4.   Will continue trazodone as scheduled. TSH reviewed 10/13/13.    Will continue to monitor vitals ,medication compliance and treatment side effects while patient is here.  Will monitor for medical issues as well as call consult as needed.  CSW will start working on disposition.  Patient to participate in therapeutic milieu .      Medical Decision Making Problem Points:  Established problem, stable/improving (1) Data Points:  Order Aims Assessment (2) Review of medication regiment & side effects (2)  I certify that inpatient services furnished can reasonably be expected to improve the patient's condition.   [name], Leanna Hamid MD 10/16/2013, 2:48 PM

## 2013-10-16 NOTE — [facility] Group Notes (Signed)
[facility] Group Notes: (Clinical Social Work)   10/16/2013      Type of Therapy:  Group Therapy   Participation Level:  Did Not Attend - declined to leave his room   Ambrose MantleMareida Grossman-Orr, LCSW 10/16/2013, 1:12 PM

## 2013-10-16 NOTE — Progress Notes (Signed)
[facility] Group Notes:  (Nursing/MHT/Case Management/Adjunct)  Date:  10/16/2013  Time:  9:07 PM  Type of Therapy:  Psychoeducational Skills  Participation Level:  Minimal  Participation Quality:  Attentive  Affect:  Not Congruent  Cognitive:  Lacking  Insight:  Lacking  Engagement in Group:  Lacking  Modes of Intervention:  Education  Summary of Progress/Problems: The patient had little to share in group except to say that "God helped me out". He would not expound any further. In terms of the theme for the day, his support system will be made up of his pastor.   Valerya Maxton S 10/16/2013, 9:07 PM

## 2013-10-16 NOTE — Progress Notes (Signed)
Patient ID: Darren [name], male   DOB: Apr 27, 1975, 38 y.o.   MRN: [id number] Psychoeducational Group Note  Date:  10/16/2013 Time:  1030  Group Topic/Focus:  healthy coping skills.   Participation Level: Did Not Attend  Participation Quality:  Not Applicable  Affect:  Not Applicable  Cognitive:  Not Applicable  Insight:  Not Applicable  Engagement in Group: Not Applicable  Additional Comments:  Did not attend.   Darren [name], Darren [name] 10/16/2013, 11:49 AM

## 2013-10-16 NOTE — Progress Notes (Signed)
Patient ID: Deatra InaMichael Erno, male   DOB: March 14, 1975, 38 y.o.   MRN: [id number] Psychoeducational Group Note  Date:  10/16/2013 Time:  1000  Group Topic/Focus:  inventory group   Participation Level: Did Not Attend  Participation Quality:  Not Applicable  Affect:  Not Applicable  Cognitive:  Not Applicable  Insight:  Not Applicable  Engagement in Group: Not Applicable  Additional Comments:  Did not attend.   Valente DavidWeaver, Edynn Gillock Brooks 10/16/2013, 11:48 AM

## 2013-10-16 NOTE — Progress Notes (Signed)
Patient ID: Darren [name], male   DOB: 1975-09-08, 38 y.o.   MRN: [id number] Nursing shift note: d: pt has been visible on and off in the milieu today. After he agreed he was assigned a student nurse this am. A: He is taking his medications. He continues to look preoccupied and when asked stated that he continues to have auditory hallucinations, but cant make out what the voices are saying. He stated he had been non complaint with his med's at home for about 3-4 months. R: he denied any si/hi presently. On his inventory sheet he wrote: sleep fair, appetite fair, energy normal, concentration good with his depression and hopelessness both at 0. Pt did not complete the inventory sheet, he refused. RN will monitor and Q 15 min ck's continue.

## 2013-10-16 NOTE — Progress Notes (Signed)
D: Pt has anxious affect and mood.  Pt denies SI/HI, reports auditory hallucinations.  Pt reports his day has been "good" and that he had a "good" phone call this evening.  Pt attended evening group and interacts appropriately with peers and staff. A: Medications administered per order.  PRN medication for anxiety and left ankle pain administered, see flowsheet.  Met with pt 1:1 and encouraged pt to report needs and concerns to staff. R: Pt verbally contracts for safety and is in no distress.  He denies needs or concerns at this time.  Pt is compliant with medications.  Will continue to monitor and assess for safety.

## 2013-10-17 LAB — LITHIUM LEVEL: Lithium Lvl: 0.39 mEq/L — ABNORMAL LOW (ref 0.80–1.40)

## 2013-10-17 NOTE — Progress Notes (Signed)
Patient ID: Darren [name], male   DOB: 1975/11/07, 38 y.o.   MRN: [id number] Psychoeducational Group Note  Date:  10/17/2013 Time:  1015am  Group Topic/Focus:  Making Healthy Choices:   The focus of this group is to help patients identify negative/unhealthy choices they were using prior to admission and identify positive/healthier coping strategies to replace them upon discharge.  Participation Level:  Active  Participation Quality:  Appropriate  Affect:  Appropriate  Cognitive:  Appropriate  Insight:  Supportive  Engagement in Group:  Supportive  Additional Comments:  Inventory group   Darren [name], Darren [name] 10/17/2013,3:28 PM

## 2013-10-17 NOTE — Plan of Care (Signed)
Problem: Diagnosis: Increased Risk For Suicide Attempt Goal: STG-Patient Will Attend All Groups On The Unit Outcome: Progressing Pt attended evening group on 10/16/2013.

## 2013-10-17 NOTE — Progress Notes (Addendum)
Patient ID: Darren [name], male   DOB: Nov 09, 1975, 38 y.o.   MR[id number] [facility]  10/17/2013 1:14 PM Darren [name]  MRN:  [id number] Subjective: Patient reports: '' I am feeling better but still hearing voices and feelling anxious".  Objective: Patient seen and chart reviewed. He reports decreased mood swings, depression and delusional thinking. However, he continues to reports anxiety, auditory hallucinations but denies visual hallucinations. Patient denies HI/SI. He is compliant with his medications and the unit milieu. Patient denies any adverse reactions to his medications.  Diagnosis:  DSM5:  Primary psychiatric diagnosis:  Schizoaffective disorder,bipolar type ,multiple episodes ,currently in acute episode   Secondary psychiatric diagnosis:  Noncompliance with medication regimen  Stimulant (cocaine) use disorder in sustained remission   Nonpsychiatric diagnosis:  Asthma    Total Time spent with patient: 25 minutes   ADL's:  Intact  Sleep: Fair  Appetite:  Fair   Psychiatric Specialty Exam: Physical Exam  Constitutional: He is oriented to person, place, and time. He appears well-developed and well-nourished.  HENT:  Head: Normocephalic and atraumatic.  Neck: Normal range of motion.  GI: Soft.  Musculoskeletal: Normal range of motion.  Neurological: He is alert and oriented to person, place, and time.  Skin: Skin is warm.  Psychiatric: His speech is normal. His mood appears anxious. His affect is blunt. He is withdrawn. Thought content is paranoid. Cognition and memory are normal. He expresses impulsivity. He exhibits a depressed mood. He expresses no homicidal and no suicidal ideation.    Review of Systems  Constitutional: Negative.   HENT: Negative.   Eyes: Negative.   Respiratory: Negative.   Cardiovascular: Negative.   Gastrointestinal: Negative.   Genitourinary: Negative.   Musculoskeletal: Negative.   Skin: Negative.   Neurological:  Negative.   Psychiatric/Behavioral: Positive for depression and hallucinations. Negative for suicidal ideas. The patient is not nervous/anxious.     Blood pressure 98/65, pulse 105, temperature 98 F (36.7 C), temperature source Oral, resp. rate 18, height 5' 5.5" (1.664 m), weight 71.215 kg (157 lb), SpO2 95.00%.Body mass index is 25.72 kg/(m^2).  General Appearance: Casual  Eye Contact::  Fair  Speech:  Normal Rate  Volume:  Normal  Mood:  Anxious and Depressed  Affect:  Restricted  Thought Process:  Irrelevant  Orientation:  Full (Time, Place, and Person)  Thought Content:  Hallucinations: Auditory  Suicidal Thoughts:  No  Homicidal Thoughts:  No  Memory:  Immediate;   Fair Recent;   Fair Remote;   Fair  Judgement:  Impaired  Insight:  Lacking  Psychomotor Activity:  Normal  Concentration:  Fair  Recall:  F[employer]Fair  Fund of Knowledge:Fair  Language: Good  Akathisia:  No    AIMS (if indicated):   0  Assets:  Communication Skills  Sleep:  Number of Hours: 6   Musculoskeletal: Strength & Muscle Tone: within normal limits Gait & Station: normal Patient leans: N/A  Current Medications: Current Facility-Administered Medications  Medication Dose Route Frequency Provider Last Rate Last Dose  . acetaminophen (TYLENOL) tablet 650 mg  650 mg Oral Q6H PRN Kerry HoughSpencer E Simon, PA-C   650 mg at 10/16/13 1947  . albuterol (PROVENTIL HFA;VENTOLIN HFA) 108 (90 BASE) MCG/ACT inhaler 1-2 puff  1-2 puff Inhalation Q6H PRN Kerry HoughSpencer E Simon, PA-C      . alum & mag hydroxide-simeth (MAALOX/MYLANTA) 200-200-20 MG/5ML suspension 30 mL  30 mL Oral Q4H PRN Kerry HoughSpencer E Simon, PA-C      . benztropine (COGENTIN) tablet  0.5 mg  0.5 mg Oral BID Jomarie Longs, MD   0.5 mg at 10/17/13 0818  . feeding supplement (ENSURE COMPLETE) (ENSURE COMPLETE) liquid 237 mL  237 mL Oral Q1400 Tenny Craw, RD   237 mL at 10/16/13 1659  . haloperidol (HALDOL) tablet 10 mg  10 mg Oral BID Jomarie Longs, MD   10 mg at  10/17/13 0818  . hydrOXYzine (ATARAX/VISTARIL) tablet 25 mg  25 mg Oral Q6H PRN Kerry Hough, PA-C   25 mg at 10/16/13 1945  . lithium carbonate capsule 300 mg  300 mg Oral BID WC Jomarie Longs, MD   300 mg at 10/17/13 0818  . magnesium hydroxide (MILK OF MAGNESIA) suspension 30 mL  30 mL Oral Daily PRN Kerry Hough, PA-C      . nicotine (NICODERM CQ - dosed in mg/24 hr) patch 7 mg  7 mg Transdermal Daily Bella Kennedy, RN   7 mg at 10/17/13 1610  . nicotine polacrilex (NICORETTE) gum 2 mg  2 mg Oral PRN Nehemiah Massed, MD      . traZODone (DESYREL) tablet 50 mg  50 mg Oral QHS,MR X 1 Kerry Hough, PA-C   50 mg at 10/16/13 2159    Lab Results:  Results for orders placed during the hospital encounter of 10/13/13 (from the past 48 hour(s))  LITHIUM LEVEL     Status: Abnormal   Collection Time    10/17/13  6:31 AM      Result Value Ref Range   Lithium Lvl 0.39 (*) 0.80 - 1.40 mEq/L   Comment: Performed at [facility]    Physical Findings: AIMS: Facial and Oral Movements Muscles of Facial Expression: None, normal Lips and Perioral Area: None, normal Jaw: None, normal Tongue: None, normal,Extremity Movements Upper (arms, wrists, hands, fingers): None, normal Lower (legs, knees, ankles, toes): None, normal, Trunk Movements Neck, shoulders, hips: None, normal, Overall Severity Severity of abnormal movements (highest score from questions above): None, normal Incapacitation due to abnormal movements: None, normal Patient's awareness of abnormal movements (rate only patient's report): No Awareness, Dental Status Current problems with teeth and/or dentures?: No Does patient usually wear dentures?: No  CIWA:    COWS:     Treatment Plan Summary: Daily contact with patient to assess and evaluate symptoms and progress in treatment Medication management  Plan: Treatment Plan/Recommendations:  Patient will benefit from inpatient treatment and stabilization.   Reviewed past medical records,treatment plan.   Will continue Haldol  10 mg po bid for psychosis. Collateral information was obtained from pastor with whom patient stays ,per her patient does well on LAI, Plan to discharge of Haldol decanoate.  Will continue cogentin for EPS.  Will continue Lithium 300 mg po bid for mood lability. Lithium level-0.39 on 10/17/13 Will continue trazodone as scheduled. TSH reviewed 10/13/13.    Will continue to monitor vitals ,medication compliance and treatment side effects while patient is here.  Will monitor for medical issues as well as call consult as needed.  CSW will start working on disposition.  Patient to participate in therapeutic milieu .      Medical Decision Making Problem Points:  Established problem, stable/improving (1) Data Points:  Order Aims Assessment (2) Review of medication regiment & side effects (2)  I certify that inpatient services furnished can reasonably be expected to improve the patient's condition.   Thedore Mins MD 10/17/2013, 1:14 PM

## 2013-10-17 NOTE — Progress Notes (Signed)
Patient ID: Darren [name], male   DOB: 1975/05/14, 38 y.o.   MRN: [id number] Psychoeducational Group Note  Date:  10/17/2013 Time:  1030am  Group Topic/Focus:  Making Healthy Choices:   The focus of this group is to help patients identify negative/unhealthy choices they were using prior to admission and identify positive/healthier coping strategies to replace them upon discharge.  Participation Level:  Active  Participation Quality:  Appropriate  Affect:  Appropriate  Cognitive:  Appropriate  Insight:  Supportive  Engagement in Group:  Supportive  Additional Comments:  Healthy support systems.   Valente DavidWeaver, Jodiann Ognibene Brooks 10/17/2013,3:29 PM

## 2013-10-17 NOTE — Progress Notes (Signed)
Patient ID: Darren [name], male   DOB: [birth year], 38 y.o.   MRN: [id number] Psychoeducational Group Note  Date:  10/17/2013 Time:  1015am  Group Topic/Focus:  Making Healthy Choices:   The focus of this group is to help patients identify negative/unhealthy choices they were using prior to admission and identify positive/healthier coping strategies to replace them upon discharge.  Participation Level:  Active  Participation Quality:  Appropriate  Affect:  Appropriate  Cognitive:  Appropriate  Insight:  Supportive  Engagement in Group:  Supportive  Additional Comments:  Inventory group   Darren [name], Darren [name] 10/17/2013,3:27 PM

## 2013-10-17 NOTE — Progress Notes (Signed)
D: Pt has flat affect and depressed mood.  Pt denies SI/HI, denies hallucinations.  Pt reports his goal for the day was to "be better."  Pt attended evening group.  Interacts with staff appropriately; minimal peer interactions.   A: Medications administered per order.  PRN medication for pain administered, see flowsheet.  Safety maintained.  Encouraged and supported pt.  R: Pt is in no distress and verbally contracts for safety.  Pt denies needs and concerns at this time.  Will continue to monitor and assess for safety.

## 2013-10-17 NOTE — [facility] Group Notes (Signed)
[facility] Group Notes:  (Clinical Social Work)  10/17/2013   11:15am-12:00pm  Summary of Progress/Problems:  The main focus of today's process group was to listen to a variety of genres of music and to identify that different types of music provoke different responses.  The patient then was able to identify personally what was soothing for them, as well as energizing.  Handouts were used to record feelings evoked, as well as how patient can personally use this knowledge in sleep habits, with depression, and with other symptoms.  The patient expressed understanding of concepts, as well as knowledge of how each type of music affected him/her and how this can be used at home as a wellness/recovery tool.  He enjoyed almost all the music a great deal, sang along with most of the music, and said he felt "blessed" at the end of group.  Type of Therapy:  Music Therapy   Participation Level:  Active  Participation Quality:  Attentive and Sharing  Affect:  Blunted but Smiling at times  Cognitive:  Oriented  Insight:  Engaged  Engagement in Therapy:  Engaged  Modes of Intervention:   Activity, Exploration  Ambrose MantleMareida Grossman-Orr, LCSW 10/17/2013, 12:30pm

## 2013-10-17 NOTE — Progress Notes (Signed)
Patient ID: Darren [name], male   DOB: May 18, 1975, 38 y.o.   MRN: [id number] 10-17-13 nursing shift note: D: pt has been visible in the milieu, going to groups and taking his medications without any adverse effects. A: when asked if his needs are being met he stated " yes". He was also given a nursing student today to work with him. He has not had any complaints of pain and has not voiced any specific needs. He continues to report auditory hallucinations, but they are minimal. He verbally denied any si/hi. R: on his inventory sheet he wrote: slept fair, appetite fair, energy normal, concentration good with his depression, hopelessness and anxiety all at "0". RN will monitor and Q 15 min ck's continue.

## 2013-10-17 NOTE — Progress Notes (Signed)
Adult Psychoeducational Group Note  Date:  10/17/2013 Time:  9:27 PM  Group Topic/Focus:  Wrap-Up Group:   The focus of this group is to help patients review their daily goal of treatment and discuss progress on daily workbooks.  Participation Level:  Active  Participation Quality:  Appropriate  Affect:  Appropriate  Cognitive:  Appropriate  Insight: Appropriate  Engagement in Group:  Engaged  Modes of Intervention:  Discussion  Additional Comments: The patient expressed that music therapy made him feel happy.The patient also said that his day was fine.  Octavio Mannshigpen, Parv Manthey Lee 10/17/2013, 9:27 PM

## 2013-10-18 DIAGNOSIS — F1490 Cocaine use, unspecified, uncomplicated: Secondary | ICD-10-CM

## 2013-10-18 DIAGNOSIS — Z9119 Patient's noncompliance with other medical treatment and regimen: Secondary | ICD-10-CM

## 2013-10-18 MED ORDER — LITHIUM CARBONATE ER 300 MG PO TBCR: 600.0000 mg | EXTENDED_RELEASE_TABLET | Freq: Every day | ORAL | Status: DC

## 2013-10-18 MED ORDER — LITHIUM CARBONATE 300 MG PO CAPS
300.0000 mg | ORAL_CAPSULE | Freq: Every day | ORAL | Status: DC
  Administered 2013-10-19: 300 mg via ORAL
  Filled 2013-10-18: qty 1
  Filled 2013-10-18: qty 9
  Filled 2013-10-18: qty 1

## 2013-10-18 MED ORDER — HALOPERIDOL DECANOATE 100 MG/ML IM SOLN
100.0000 mg | INTRAMUSCULAR | Status: DC
  Administered 2013-10-18: 100 mg via INTRAMUSCULAR
  Filled 2013-10-18: qty 1

## 2013-10-18 MED ORDER — LITHIUM CARBONATE 300 MG PO CAPS
600.0000 mg | ORAL_CAPSULE | Freq: Every day | ORAL | Status: DC
  Administered 2013-10-18: 600 mg via ORAL
  Filled 2013-10-18 (×2): qty 2

## 2013-10-18 MED ORDER — BENZTROPINE MESYLATE 1 MG/ML IJ SOLN
1.0000 mg | INTRAMUSCULAR | Status: DC
  Administered 2013-10-18: 1 mg via INTRAMUSCULAR
  Filled 2013-10-18: qty 1

## 2013-10-18 NOTE — Progress Notes (Signed)
D: Patient cooperative with staff. Patient has appropriate affect and depressed mood. Writer observes that the patient isolates to his room. He reported on the self inventory sheet that sleep and appetite are fair and energy level is normal. Patient rated depression, feelings of hopelessness and anxiety "0". He's attending groups, but no interaction with peers on the hall. Patient is compliant with medications.  A: Support and encouragement provided to patient. Scheduled medications administered per MD orders. Maintain Q15 minute checks for safety.  R: Patient receptive. Denies SI/HI and auditory/visual hallucinations. Patient remains safe.

## 2013-10-18 NOTE — Tx Team (Signed)
  Interdisciplinary Treatment Plan Update   Date Reviewed:  10/18/2013  Time Reviewed:  8:14 AM  Progress in Treatment:   Attending groups: Yes Participating in groups: Yes Taking medication as prescribed: Yes  Tolerating medication: Yes Family/Significant other contact made: Yes  Patient understands diagnosis: Yes  Discussing patient identified problems/goals with staff: Yes  See initial care plan Medical problems stabilized or resolved: Yes Denies suicidal/homicidal ideation: Yes  In tx team Patient has not harmed self or others: Yes  For review of initial/current patient goals, please see plan of care.  Estimated Length of Stay:  D/C tomorrow  Reason for Continuation of Hospitalization:   New Problems/Goals identified:  N/A  Discharge Plan or Barriers:   return home, [facility]  Additional Comments:  Attendees:  Signature: Ivin BootySarama Eappen, MD 10/18/2013 8:14 AM   Signature: Richelle Itood Rob Mciver, LCSW 10/18/2013 8:14 AM  Signature: Fransisca KaufmannLaura Davis, NP 10/18/2013 8:14 AM  Signature: Joslyn Devonaroline Beaudry, RN 10/18/2013 8:14 AM  Signature: Liborio NixonPatrice White, RN 10/18/2013 8:14 AM  Signature:  10/18/2013 8:14 AM  Signature:   10/18/2013 8:14 AM  Signature:    Signature:    Signature:    Signature:    Signature:    Signature:      Scribe for Treatment Team:   Richelle Itood Lyden Redner, LCSW  10/18/2013 8:14 AM

## 2013-10-18 NOTE — Progress Notes (Signed)
Patient ID: Darren [name], male   DOB: October 05, 1975, 38 y.o.   MRN: [id number] [facility] MD Progress Note  10/18/2013 1:56 PM Darren [name]  MRN:  [id number] Subjective: Patient reports: '' I am feeling better ."  Objective: Patient seen and chart reviewed. He appears to be more pleasant with more stable mood . Patient continues to have some AH which he describes as noises' but reports they are reduced. Patient denies any SI/HI/VH. He is compliant with his medications and the unit milieu. Patient denies any adverse reactions to his medications. Discussed LAI haldol decanoate -he is agreeable.  Diagnosis:  DSM5:  Primary psychiatric diagnosis:  Schizoaffective disorder,bipolar type ,multiple episodes ,currently in acute episode   Secondary psychiatric diagnosis:  Noncompliance with medication regimen  Stimulant (cocaine) use disorder in sustained remission   Nonpsychiatric diagnosis:  Asthma    Total Time spent with patient: 25 minutes   ADL's:  Intact  Sleep: Fair  Appetite:  Fair   Psychiatric Specialty Exam: Physical Exam  Constitutional: He is oriented to person, place, and time. He appears well-developed and well-nourished.  HENT:  Head: Normocephalic and atraumatic.  Neck: Normal range of motion.  GI: Soft.  Musculoskeletal: Normal range of motion.  Neurological: He is alert and oriented to person, place, and time.  Skin: Skin is warm.  Psychiatric: His speech is normal. His mood appears anxious. He is actively hallucinating. Thought content is not paranoid. Cognition and memory are normal. He expresses impulsivity. He expresses no homicidal and no suicidal ideation.    Review of Systems  Constitutional: Negative.   HENT: Negative.   Eyes: Negative.   Respiratory: Negative.   Cardiovascular: Negative.   Gastrointestinal: Negative.   Genitourinary: Negative.   Musculoskeletal: Negative.   Skin: Negative.   Neurological: Negative.   Psychiatric/Behavioral: Positive  for hallucinations. Negative for suicidal ideas. The patient is not nervous/anxious.     Blood pressure 94/62, pulse 116, temperature 97.7 F (36.5 C), temperature source Oral, resp. rate 16, height 5' 5.5" (1.664 m), weight 71.215 kg (157 lb), SpO2 95.00%.Body mass index is 25.72 kg/(m^2).  General Appearance: Casual  Eye Contact::  Fair  Speech:  Normal Rate  Volume:  Normal  Mood:  Anxious improving  Affect:  Full Range  Thought Process:  Goal Directed  Orientation:  Full (Time, Place, and Person)  Thought Content:  Hallucinations: Auditory improving ,has mild noises  Suicidal Thoughts:  No  Homicidal Thoughts:  No  Memory:  Immediate;   Good Recent;   Good Remote;   Good  Judgement:  Fair  Insight:  Fair  Psychomotor Activity:  Normal  Concentration:  Fair  Recall:  Fair  Fund of Knowledge:Fair  Language: Good  Akathisia:  No    AIMS (if indicated):   0  Assets:  Communication Skills  Sleep:  Number of Hours: 6   Musculoskeletal: Strength & Muscle Tone: within normal limits Gait & Station: normal Patient leans: N/A  Current Medications: Current Facility-Administered Medications  Medication Dose Route Frequency Provider Last Rate Last Dose  . acetaminophen (TYLENOL) tablet 650 mg  650 mg Oral Q6H PRN Kerry Hough, PA-C   650 mg at 10/17/13 2106  . albuterol (PROVENTIL HFA;VENTOLIN HFA) 108 (90 BASE) MCG/ACT inhaler 1-2 puff  1-2 puff Inhalation Q6H PRN Kerry Hough, PA-C      . alum & mag hydroxide-simeth (MAALOX/MYLANTA) 200-200-20 MG/5ML suspension 30 mL  30 mL Oral Q4H PRN Kerry Hough, PA-C      .  benztropine (COGENTIN) tablet 0.5 mg  0.5 mg Oral BID Jomarie LongsSaramma Aniqa Hare, MD   0.5 mg at 10/18/13 0830  . benztropine mesylate (COGENTIN) injection 1 mg  1 mg Intramuscular Q30 days Jomarie LongsSaramma Lydell Moga, MD   1 mg at 10/18/13 1344  . feeding supplement (ENSURE COMPLETE) (ENSURE COMPLETE) liquid 237 mL  237 mL Oral Q1400 Tenny CrawHeather S Winkler, RD   237 mL at 10/18/13 1353  .  haloperidol (HALDOL) tablet 10 mg  10 mg Oral BID Jomarie LongsSaramma Dary Dilauro, MD   10 mg at 10/18/13 0830  . haloperidol decanoate (HALDOL DECANOATE) 100 MG/ML injection 100 mg  100 mg Intramuscular Q30 days Jomarie LongsSaramma Jessy Calixte, MD   100 mg at 10/18/13 1344  . hydrOXYzine (ATARAX/VISTARIL) tablet 25 mg  25 mg Oral Q6H PRN Kerry HoughSpencer E Simon, PA-C   25 mg at 10/16/13 1945  . [START ON 10/19/2013] lithium carbonate capsule 300 mg  300 mg Oral Q breakfast Twanna Resh, MD      . lithium carbonate capsule 600 mg  600 mg Oral Q supper Beren Yniguez, MD      . magnesium hydroxide (MILK OF MAGNESIA) suspension 30 mL  30 mL Oral Daily PRN Kerry HoughSpencer E Simon, PA-C      . nicotine (NICODERM CQ - dosed in mg/24 hr) patch 7 mg  7 mg Transdermal Daily Bella KennedyStephen B Weaver, RN   7 mg at 10/18/13 0830  . nicotine polacrilex (NICORETTE) gum 2 mg  2 mg Oral PRN Nehemiah MassedFernando Cobos, MD      . traZODone (DESYREL) tablet 50 mg  50 mg Oral QHS,MR X 1 Kerry HoughSpencer E Simon, PA-C   50 mg at 10/17/13 2106    Lab Results:  Results for orders placed during the hospital encounter of 10/13/13 (from the past 48 hour(s))  LITHIUM LEVEL     Status: Abnormal   Collection Time    10/17/13  6:31 AM      Result Value Ref Range   Lithium Lvl 0.39 (*) 0.80 - 1.40 mEq/L   Comment[facility]    Physical Findings: AIMS: Facial and Oral Movements Muscles of Facial Expression: None, normal Lips and Perioral Area: None, normal Jaw: None, normal Tongue: None, normal,Extremity Movements Upper (arms, wrists, hands, fingers): None, normal Lower (legs, knees, ankles, toes): None, normal, Trunk Movements Neck, shoulders, hips: None, normal, Overall Severity Severity of abnormal movements (highest score from questions above): None, normal Incapacitation due to abnormal movements: None, normal Patient's awareness of abnormal movements (rate only patient's report): No Awareness, Dental Status Current problems with teeth and/or  dentures?: No Does patient usually wear dentures?: No  CIWA:    COWS:     Treatment Plan Summary: Daily contact with patient to assess and evaluate symptoms and progress in treatment Medication management  Plan: Treatment Plan/Recommendations:  Patient will benefit from inpatient treatment and stabilization.  Reviewed past medical records,treatment plan.   Will continue Haldol  10 mg po bid for psychosis. Will give IM Haldol decanoate 100 mg IM Q30 days along with cogentin 1 mg IM. Will continue cogentin for EPS.  Will increase Lithium to 900 mg po daily for mood lability. Lithium level-0.39 on 10/17/13 Will continue trazodone as scheduled.     Will continue to monitor vitals ,medication compliance and treatment side effects while patient is here.  Will monitor for medical issues as well as call consult as needed.  CSW will start working on disposition.  Patient to participate in therapeutic milieu .  Medical Decision Making Problem Points:  Established problem, stable/improving (1) Data Points:  Order Aims Assessment (2) Review of medication regiment & side effects (2) Review of new medications or change in dosage (2)  I certify that inpatient services furnished can reasonably be expected to improve the patient's condition.   Yaacov Koziol MD 10/18/2013, 1:56 PM

## 2013-10-18 NOTE — Plan of Care (Signed)
Problem: Ineffective individual coping Goal: STG: Patient will remain free from self harm Outcome: Progressing Pt has not harmed himself this shift and agreed to notify staff if thinking of harming himself.    Problem: Alteration in thought process Goal: LTG-Patient has not harmed self or others in at least 2 days Outcome: Completed/Met Date Met:  10/18/13 Pt has not harmed himself or anyone in else in the past two days.

## 2013-10-18 NOTE — Progress Notes (Signed)
D: Patient in hte hallway on approach.  Patient states he had a good day.  Patient states he has been taking his medications and states he can tell a difference.  Patient states he has learned he needs to continue to take his medications, and states there is always someone with problems worse than yours.  Patient denies SI/HI and denies AVH. A: Staff to monitor Q 15 mins for safety.  Encouragement and support offered.  Scheduled medications administered per orders. R: Patient remains safe on the unit.  Patient attended group tonight.  Patient visible on the unit and interacting with peers.  Patient taking administered medications.

## 2013-10-18 NOTE — [facility] Group Notes (Signed)
[facility]   10/18/2013 4:43 PM  Participation Quality:  Minimal  Mood/Affect:  Flat  Depression Rating:  denies  Anxiety Rating:  denies  Thoughts of Suicide:  No Will you contract for safety?   NA  Current AVH:  No  Plan for Discharge/Comments:  "I'm doing a lot better today.  I get the shot today, and the Dr might let me go home tomorrow."  Will return home, follow up [facility].  Transportation Means: pastor  Supports: pastor  [place], BisonRodney [name]

## 2013-10-18 NOTE — [facility] Group Notes (Signed)
[facility] LCSW Group Therapy  10/18/2013 1:15 pm  Type of Therapy: Process Group Therapy  Participation Level:  Active  Participation Quality:  Appropriate  Affect:  Flat  Cognitive:  Oriented  Insight:  Improving  Engagement in Group:  Limited  Engagement in Therapy:  Limited  Modes of Intervention:  Activity, Clarification, Education, Problem-solving and Support  Summary of Progress/Problems: Today's group addressed the issue of overcoming obstacles.  Patients were asked to identify their biggest obstacle post d/c that stands in the way of their on-going success, and then problem solve as to how to manage this. "I'm really hardheaded.  When I get stressed, I don't ask for help."  Also [facility] and [facility].  States that hewill try to ask for help in the future, and do it more preventatively rather than waiting until he has to come to the hospital.  "I also need to stay focused, and make mental illness a priority.'  Ida Rogueorth, Darren [name] B 10/18/2013   4:45 PM

## 2013-10-19 MED ORDER — HALOPERIDOL 10 MG PO TABS: 10.0000 mg | ORAL_TABLET | Freq: Two times a day (BID) | ORAL | Status: AC

## 2013-10-19 MED ORDER — BENZTROPINE MESYLATE 1 MG/ML IJ SOLN: 1.0000 mg | INTRAMUSCULAR | Status: AC

## 2013-10-19 MED ORDER — TRAZODONE HCL 50 MG PO TABS: 50.0000 mg | ORAL_TABLET | Freq: Every evening | ORAL | Status: AC | PRN

## 2013-10-19 MED ORDER — HALOPERIDOL DECANOATE 100 MG/ML IM SOLN: 100.0000 mg | INTRAMUSCULAR | Status: AC

## 2013-10-19 MED ORDER — LITHIUM CARBONATE 300 MG PO CAPS: 300.0000 mg | ORAL_CAPSULE | Freq: Every day | ORAL | Status: AC

## 2013-10-19 MED ORDER — LITHIUM CARBONATE 600 MG PO CAPS: 600.0000 mg | ORAL_CAPSULE | Freq: Every day | ORAL | Status: AC

## 2013-10-19 MED ORDER — BENZTROPINE MESYLATE 0.5 MG PO TABS: 0.5000 mg | ORAL_TABLET | Freq: Two times a day (BID) | ORAL | Status: AC

## 2013-10-19 NOTE — Progress Notes (Signed)
D:  Patient's self inventory sheet, patient had fair sleep last night.  Fair appetite.  Denied anxiety, hopeless, depression this morning.  Does have discharge plans.   A:  Medications administered per MD orders.  Emotional support and encouragement given patient. R:  Denied SI and HI, contracts for safety.  Denied A/V hallucinations.  Safety maintained with 15 minute checks.

## 2013-10-19 NOTE — Discharge Summary (Signed)
Patient was seen face to face for psychiatric evaluation, suicide risk assessment and case discussed with treatment team and NP and made appropriate disposition plans. Reviewed the information documented and agree with the treatment plan.   Dorse Locy ,MD Attending Psychiatrist  [facility]    

## 2013-10-19 NOTE — Progress Notes (Signed)
The focus of this group is to educate the patient on the purpose and policies of crisis stabilization and provide a format to answer questions about their admission.  The group details unit policies and expectations of patients while admitted.  Patient did not attend nurse education orientation group this morning.  Patient stayed in his room.  

## 2013-10-19 NOTE — Progress Notes (Signed)
Discharge Note:  Patient denied SI and HI.  Denied A/V hallucinations.  Suicide prevention information given and discussed with patient who stated she understood and had no questions.  Patient stated he received all his belongings, clothing, toiletries, misc items, prescriptions, medications, shoes and shoestrings.  Patient stated he [facility].

## 2013-10-19 NOTE — [facility] Suicide Risk Assessment (Signed)
Demographic Factors:  Male  Total Time spent with patient: 20 minutes  Psychiatric Specialty Exam: Physical Exam  Constitutional: He is oriented to person, place, and time. He appears well-developed and well-nourished.  HENT:  Head: Normocephalic and atraumatic.  Neck: Normal range of motion.  Respiratory: Effort normal.  GI: Soft.  Musculoskeletal: Normal range of motion.  Neurological: He is alert and oriented to person, place, and time.  Skin: Skin is warm.  Psychiatric: He has a normal mood and affect. His speech is normal and behavior is normal. Judgment normal. He is not actively hallucinating. Thought content is not paranoid. Cognition and memory are normal. He expresses no homicidal and no suicidal ideation.    Review of Systems  Constitutional: Negative.   HENT: Negative.   Eyes: Negative.   Respiratory: Negative.   Cardiovascular: Negative.   Gastrointestinal: Negative.   Genitourinary: Negative.   Musculoskeletal: Negative.   Skin: Negative.   Neurological: Negative.   Psychiatric/Behavioral: Negative for depression, suicidal ideas and hallucinations. The patient is not nervous/anxious and does not have insomnia.     Blood pressure 112/66, pulse 94, temperature 98.1 F (36.7 C), temperature source Oral, resp. rate 18, height 5' 5.5" (1.664 m), weight 71.215 kg (157 lb), SpO2 95.00%.Body mass index is 25.72 kg/(m^2).  General Appearance: Casual  Eye Contact::  Fair  Speech:  Clear and Coherent  Volume:  Normal  Mood:  Euthymic  Affect:  Congruent  Thought Process:  Coherent  Orientation:  Full (Time, Place, and Person)  Thought Content:  WDL  Suicidal Thoughts:  No  Homicidal Thoughts:  No  Memory:  Immediate;   Fair Recent;   Fair Remote;   Fair  Judgement:  Fair  Insight:  Fair  Psychomotor Activity:  Normal  Concentration:  Fair  Recall:  [employer] of Knowledge:Fair  Language: Fair  Akathisia:  No    AIMS (if indicated):   0  Assets:   Communication Skills Desire for Improvement Social Support  Sleep:  Number of Hours: 6.25    Musculoskeletal: Strength & Muscle Tone: within normal limits Gait & Station: normal Patient leans: N/A   Mental Status Per Nursing Assessment::   On Admission:  Suicidal ideation indicated by patient;Self-harm thoughts;Self-harm behaviors;Intention to act on suicide plan  Current Mental Status by Physician: denies SI/HI/AH/VH  Loss Factors: NA  Historical Factors: Prior suicide attempts and Impulsivity  Risk Reduction Factors:   Living with another person, especially a relative, Positive social support and Positive therapeutic relationship  Continued Clinical Symptoms:  Previous Psychiatric Diagnoses and Treatments  Cognitive Features That Contribute To Risk:  Polarized thinking    Suicide Risk:  Minimal: No identifiable suicidal ideation.  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 Paranoid schizophrenia   . Schizophrenia   . Bipolar disorder (manic depression)   . Asthma     Plan Of Care/Follow-up recommendations:  Activity:  No restrictions  Is patient on multiple antipsychotic therapies at discharge:  No   Has Patient had three or more failed trials of antipsychotic monotherapy by history:  No  Recommended  Plan for Multiple Antipsychotic Therapies: NA    Osmara Drummonds MD 10/19/2013, 9:39 AM

## 2013-10-19 NOTE — Discharge Summary (Signed)
Physician Discharge Summary Note  Patient:  Darren [name] is an 38 y.o., male MRN:  [id number] DOB:  1975/05/13 Patient phone:  [phone] (home)  Patient address:   [place] [address],  Total Time spent with patient: 30 minutes  Date of Admission:  10/13/2013 Date of Discharge: 10/19/13  Reason for Admission: Psychosis   Discharge Diagnoses: Active Problems:   Paranoid schizophrenia  Psychiatric Specialty Exam: Physical Exam  Psychiatric: He has a normal mood and affect. His speech is normal and behavior is normal. Judgment and thought content normal. Cognition and memory are normal.    Review of Systems  Constitutional: Negative.   HENT: Negative.   Eyes: Negative.   Respiratory: Negative.   Cardiovascular: Negative.   Gastrointestinal: Negative.   Genitourinary: Negative.   Musculoskeletal: Negative.   Skin: Negative.   Neurological: Negative.   Endo/Heme/Allergies: Negative.   Psychiatric/Behavioral: Positive for depression (Stable ), suicidal ideas (Stable ) and hallucinations (Stable ).    Blood pressure 112/66, pulse 94, temperature 98.1 F (36.7 C), temperature source Oral, resp. rate 18, height 5' 5.5" (1.664 m), weight 71.215 kg (157 lb), SpO2 95.00%.Body mass index is 25.72 kg/(m^2).   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  Patient has a history of schizoaffective disorder as well as noncompliance with medications. Patient was recently discharged from [facility] ,after his recent admission from 08/11/2013 - 08/24/2013. Patient at that time was discharged on Risperdal consta 25 mg. Patient reports that he has been noncompliant with his medications as well as his LAI ,since he could not get transportation. Patient reports feeling depressed as well as having a lot of guilt. He feels regretful about his past. He reports that he was so depressed that he got angry at his pastor and also attempted to cut self. However ED notes states that patient tried to jump off his balcony . Patient also had ankle xray done ,which was WNL. Patient today reports mood swings ,sadness ,irritability ,SI with no plan ,as well as hallucinations. Patient reports hearing a lot of noise as well as seeing small people floating around. Patient reports sleep issues and feeling tired and not rested in the AM. Patient reports being paranoid. He also reports feeling anxious all the time. Patient has had several hospitalizations since the age of 80 y, that is when he was diagnosed with Bipolar do for the first time. Patient also reports several suicide attempts -more than he can count. 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 He used to go to [employer] in the past. Patient currently lives with pastor,spends his time cleaning the house ,shopping and reading. Reports that he came to [facility]  from [place] , a year ago ,as part of his mission work. Patient has family in [place] ,[place] ,has parents ,ex-wife as well as 5 children there. He keeps in touch with them.         Darren [name] was admitted to the adult 400 unit. He was evaluated and his symptoms were identified. Medication management was discussed and initiated. As mentioned above the patient had not been taking any psychiatric medications at the time of his admission. Patient was started  on oral Haldol for psychosis and Lithium for mood stabilization. Due to his history of stopping his oral medications the treatment team decided to start the patient on Haldol Decanoate 100 mg monthly. He received the first dose on 10/18/13. He was oriented to the unit and encouraged to participate in unit programming. Medical problems were identified and treated appropriately. Home medication was restarted as needed.        The patient was evaluated each day by a clinical provider to ascertain the patient's response to treatment.  Improvement was noted by the patient's report of decreasing symptoms, improved sleep and appetite, affect, medication tolerance, behavior, and participation in unit programming.  He was asked each day to complete a self inventory noting mood, mental status, pain, new symptoms, anxiety and concerns.         He responded well to medication and being in a therapeutic and supportive environment. Positive and appropriate behavior was noted and the patient was motivated for recovery.  The patient worked closely with the treatment team and case manager to develop a discharge plan with appropriate goals. Coping skills, problem solving as well as relaxation therapies were also part of the unit programming.         By the day of discharge he was in much improved condition than upon admission.  At times he reported hearing some "noises" but the hallucinations were much improved since admission. Symptoms were reported as significantly decreased or resolved completely.  The patient denied SI/HI and voiced no AVH. He was motivated to continue taking medication with a goal of continued improvement in mental health.  Darren [name] was discharged home with a plan to follow up as noted below. Patient was [facility] from [facility] in stable condition. He was provided with prescriptions and sample medications.   Consults:  None  Significant Diagnostic Studies:  None  Discharge Vitals:   Blood pressure  112/66, pulse 94, temperature 98.1 F (36.7 C), temperature source Oral, resp. rate 18, height 5' 5.5" (1.664 m), weight 71.215 kg (157 lb), SpO2 95.00%. Body mass index is 25.72 kg/(m^2). Lab Results:   Results for orders placed during the hospital encounter of 10/13/13 (from the past 72 hour(s))  LITHIUM LEVEL     Status: Abnormal   Collection Time    10/17/13  6:31 AM      Result Value Ref Range   Lithium Lvl 0.39 (*) 0.80 - 1.40 mEq/L   Comment: Performed at [facility]    Physical Findings: AIMS: Facial and Oral Movements Muscles of Facial Expression: None, normal Lips and Perioral Area: None, normal Jaw: None, normal Tongue: None, normal,Extremity Movements Upper (arms, wrists, hands, fingers): None, normal Lower (legs, knees, ankles, toes): None, normal, Trunk Movements Neck, shoulders, hips: None, normal, Overall Severity Severity of abnormal movements (highest score from questions above): None, normal Incapacitation due to abnormal movements: None, normal Patient's awareness of abnormal movements (rate only patient's report): No Awareness, Dental Status Current problems with teeth and/or  dentures?: No Does patient usually wear dentures?: No  CIWA:  CIWA-Ar Total: 0 COWS:  COWS Total Score: 1  Psychiatric Specialty Exam: See Psychiatric Specialty Exam and Suicide Risk Assessment completed by Attending Physician prior to discharge.  Discharge destination:  Home  Is patient on multiple antipsychotic therapies at discharge:  No   Has Patient had three or more failed trials of antipsychotic monotherapy by history:  No  Recommended Plan for Multiple Antipsychotic Therapies: NA     Medication List       Indication   albuterol 108 (90 BASE) MCG/ACT inhaler  Commonly known as:  PROVENTIL HFA;VENTOLIN HFA  Inhale into the lungs every 6 (six) hours as needed for wheezing or shortness of breath.      benztropine 0.5 MG tablet  Commonly known as:   COGENTIN  Take 1 tablet (0.5 mg total) by mouth 2 (two) times daily.   Indication:  Extrapyramidal Reaction caused by Medications     benztropine mesylate 1 MG/ML injection  Commonly known as:  COGENTIN  Inject 1 mL (1 mg total) into the muscle every 30 (thirty) days. To be given with Haldol Dec Injection to prevent side effects.   Indication:  Extrapyramidal Reaction caused by Medications     haloperidol 10 MG tablet  Commonly known as:  HALDOL  Take 1 tablet (10 mg total) by mouth 2 (two) times daily.   Indication:  Psychosis     haloperidol decanoate 100 MG/ML injection  Commonly known as:  HALDOL DECANOATE  Inject 1 mL (100 mg total) into the muscle every 30 (thirty) days. Next dose due on November 4th  Start taking on:  11/17/2013   Indication:  Psychosis     lithium carbonate 300 MG capsule  Take 1 capsule (300 mg total) by mouth daily with breakfast.   Indication:  Schizoaffective Disorder     lithium 600 MG capsule  Take 1 capsule (600 mg total) by mouth daily with supper. For mood control.   Indication:  Schizoaffective Disorder     traZODone 50 MG tablet  Commonly known as:  DESYREL  Take 1 tablet (50 mg total) by mouth at bedtime and may repeat dose one time if needed.   Indication:  Trouble Sleeping       Follow-up Information   Follow up with [facility] On 10/22/2013. (Friday at 11:15 with Joanne CharsJennifer Jackson )    Contact information:   [address] [place], [place] [address] Phone: [phone] Fax: [phone]      Follow-up recommendations:  Activity:  As tolerated Diet:  Heart healthy with low sodium.  Comments:  Take all medications as prescribed. Keep all follow-up appointments as scheduled.  Do not consume alcohol or use illegal drugs while on prescription medications. Report any adverse effects from your medications to your primary care provider promptly.  In the event of recurrent symptoms or worsening symptoms, call 911, a crisis hotline, or go to  the nearest emergency department for evaluation.   Total Discharge Time:  Greater than 30 minutes.  SignedFransisca Kaufmann: Srikar Chiang, NP-C 10/19/2013, 2:08 PM

## 2013-10-19 NOTE — [facility] Group Notes (Signed)
[facility] LCSW Group Therapy  10/19/2013 1:34 PM  Type of Therapy:  Group Therapy  Participation Level:  Minimal  Participation Quality:  Drowsy  Affect:  Appropriate  Cognitive:  Oriented  Insight:  Improving  Engagement in Therapy:  Improving  Modes of Intervention:  Clarification, Confrontation, Discussion, Education, Exploration, Limit-setting, Problem-solving, Rapport Building, Socialization and Support  Summary of Progress/Problems: Feelings around Diagnosis-Patients were encouraged to process their feelings surrounding their mental health diagnosis, discuss how they view their diagnosis, and identify the positive and negative aspects of having a mental health diagnosis. Darren NeedleMichael was drowsy during group but made an effort to remain attentive. He shared that, for him, his mental health diagnosis was "clarification and helpful." Darren NeedleMichael stated that he was happy to know the reason why his symptoms impacted him the way they did and acknowledged that knowing his diagnosis was helpful in treating symptoms. "I know I have to be mindful of my thoughts and take my medication."   Smart, Darren [name] LCSWA  10/19/2013, 1:34 PM

## 2013-10-22 NOTE — Progress Notes (Signed)
Patient Discharge Instructions:  [facility]  No ROI is available.  Jerelene ReddenSheena E Allen, 10/22/2013, 3:29 PM
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
## 2015-04-12 IMAGING — CR DG NECK SOFT TISSUE
1 series · 1 of 1 positions shown · non-contrast
Comparison: None.

CLINICAL DATA: Neck pain

EXAM:
NECK SOFT TISSUES - 1+ VIEW

[w soft tissue neck]
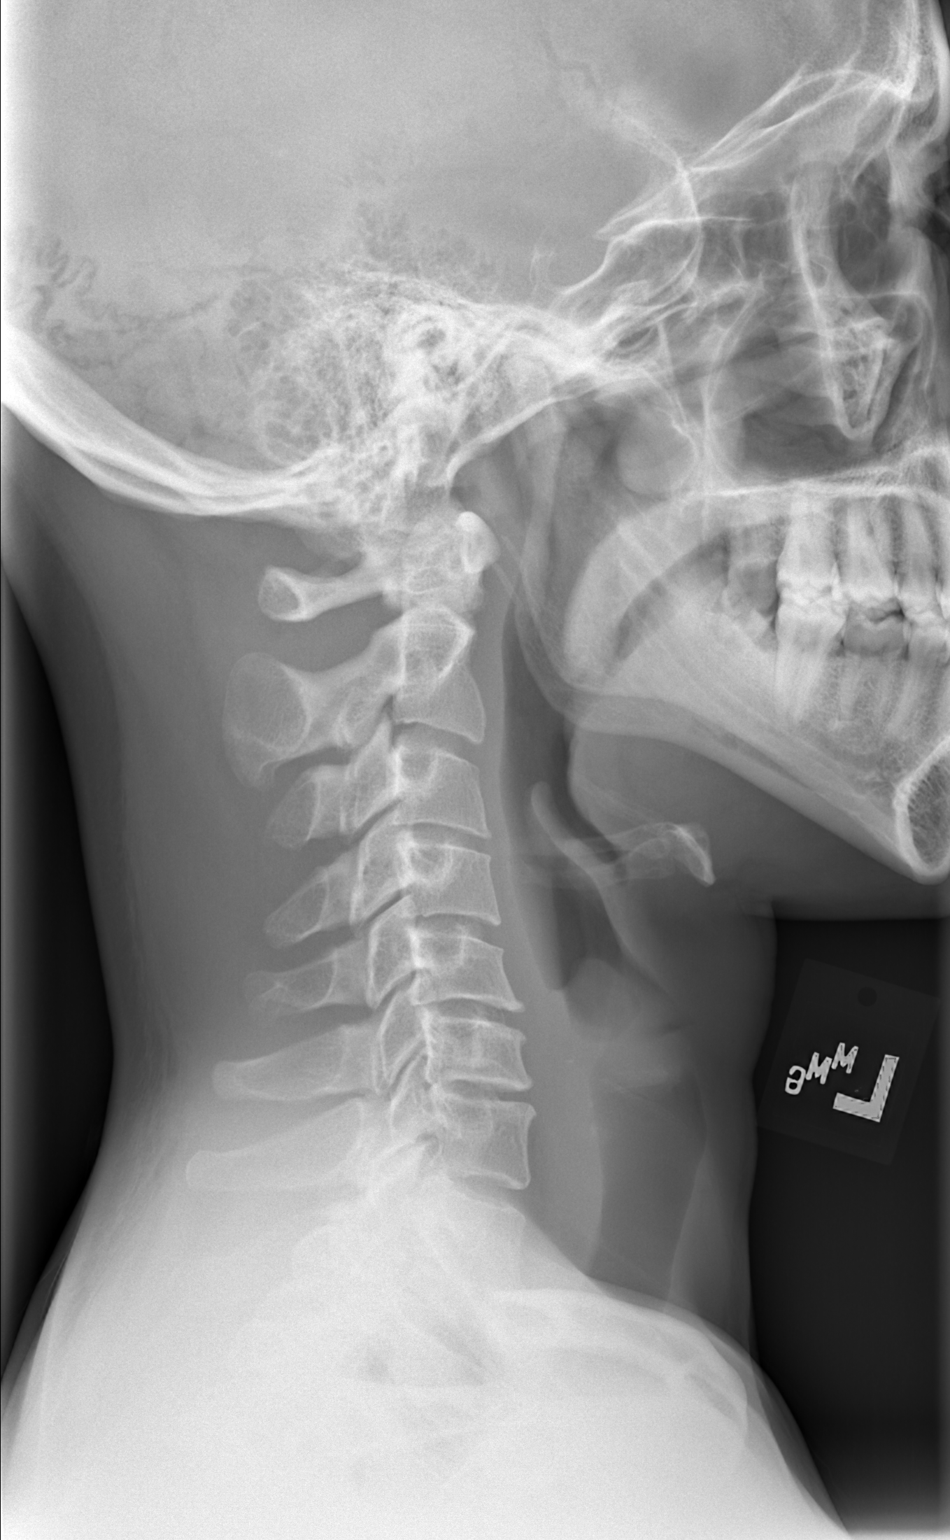

[1 of 1 positions shown; findings below may reference images not displayed]

FINDINGS: There is no evidence of retropharyngeal soft tissue swelling or
epiglottic enlargement. The cervical airway is unremarkable and no
radio-opaque foreign body identified. Minor lower cervical
degenerative disc disease at C5-6 and C6-7.
IMPRESSION: No acute finding by plain radiography

## 2015-08-02 IMAGING — CR DG CHEST 2V
2 series · 2 of 2 positions shown · non-contrast
Comparison: No priors.

CLINICAL DATA: Difficulty swallowing.  Chest pressure.

EXAM:
CHEST  2 VIEW

[w chest pa]
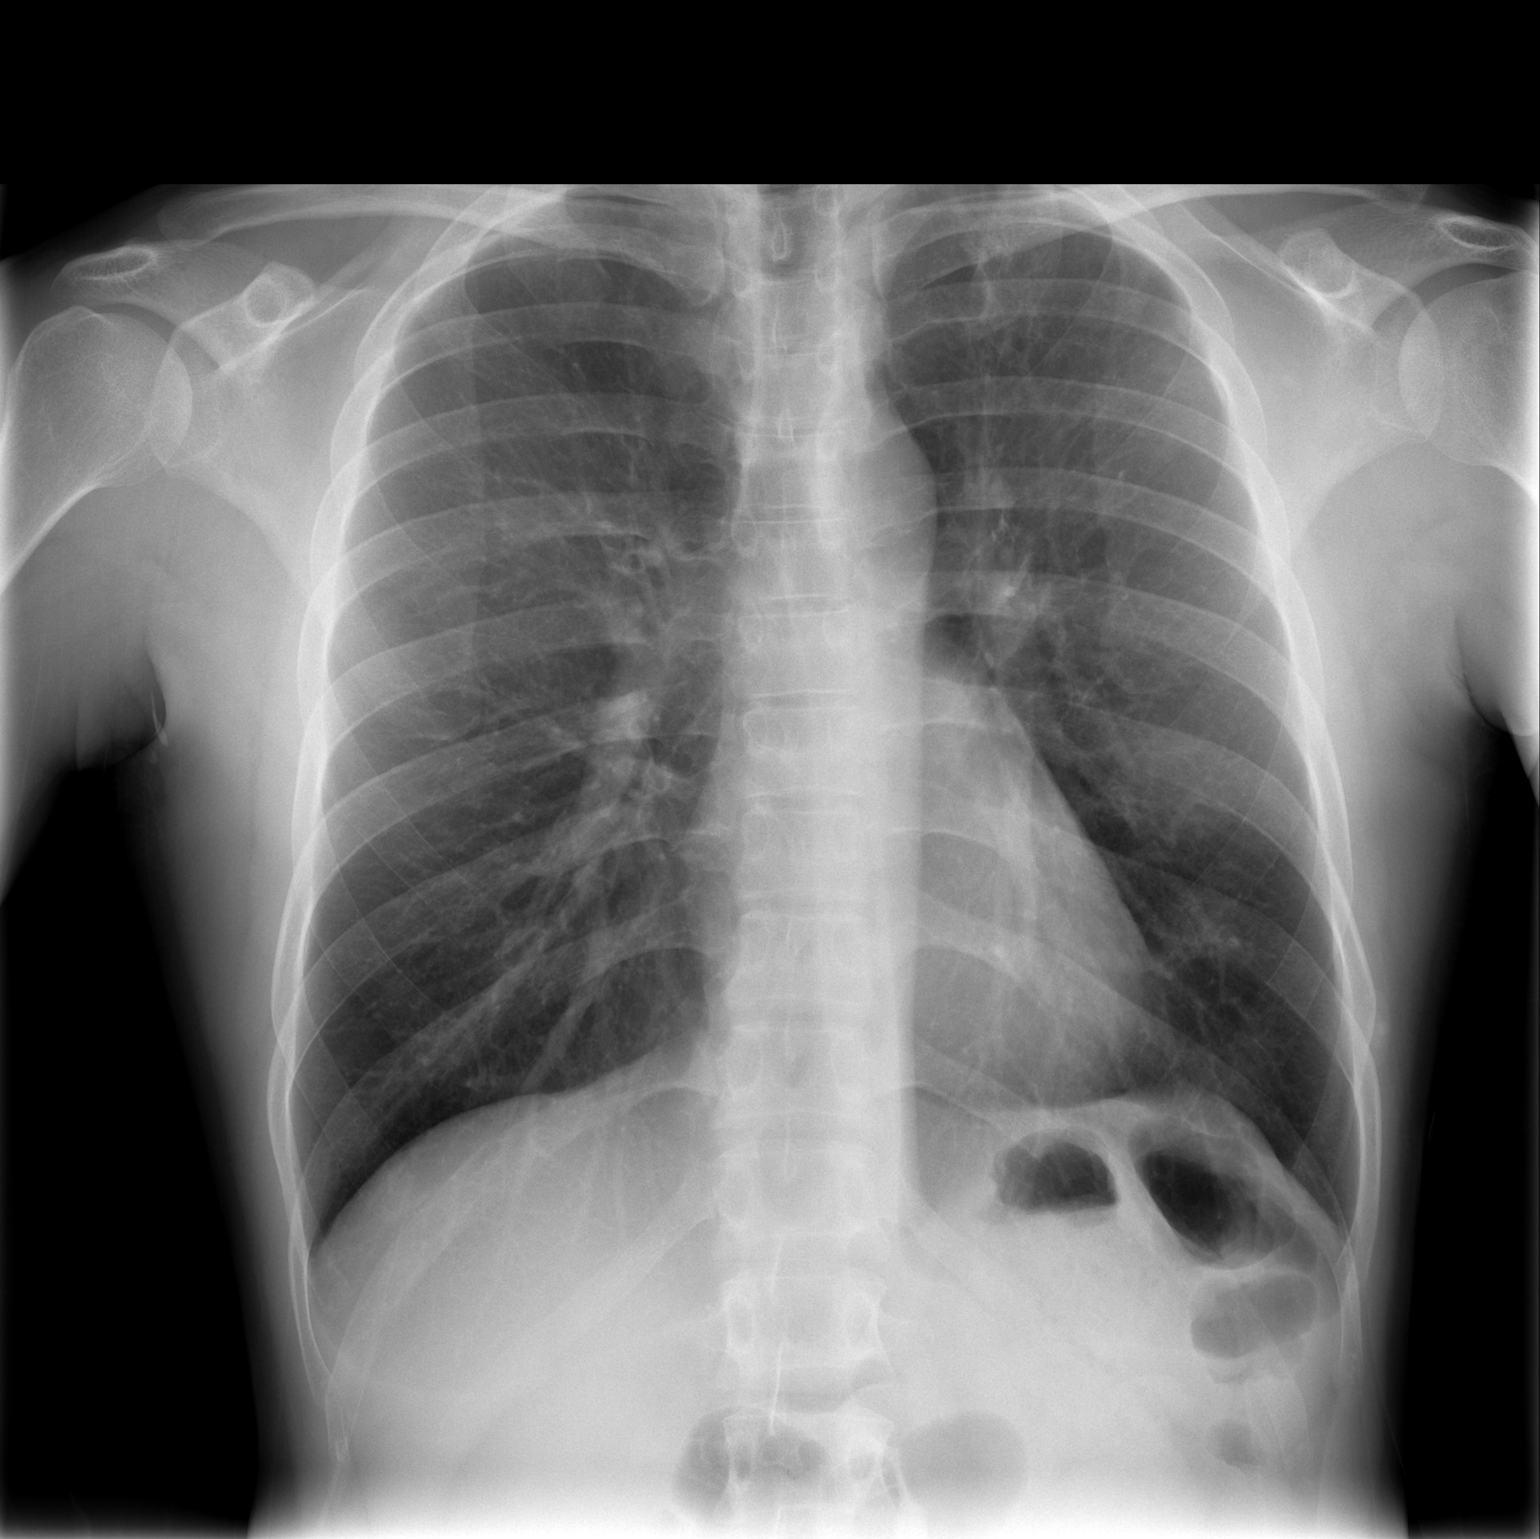

[w chest lat]
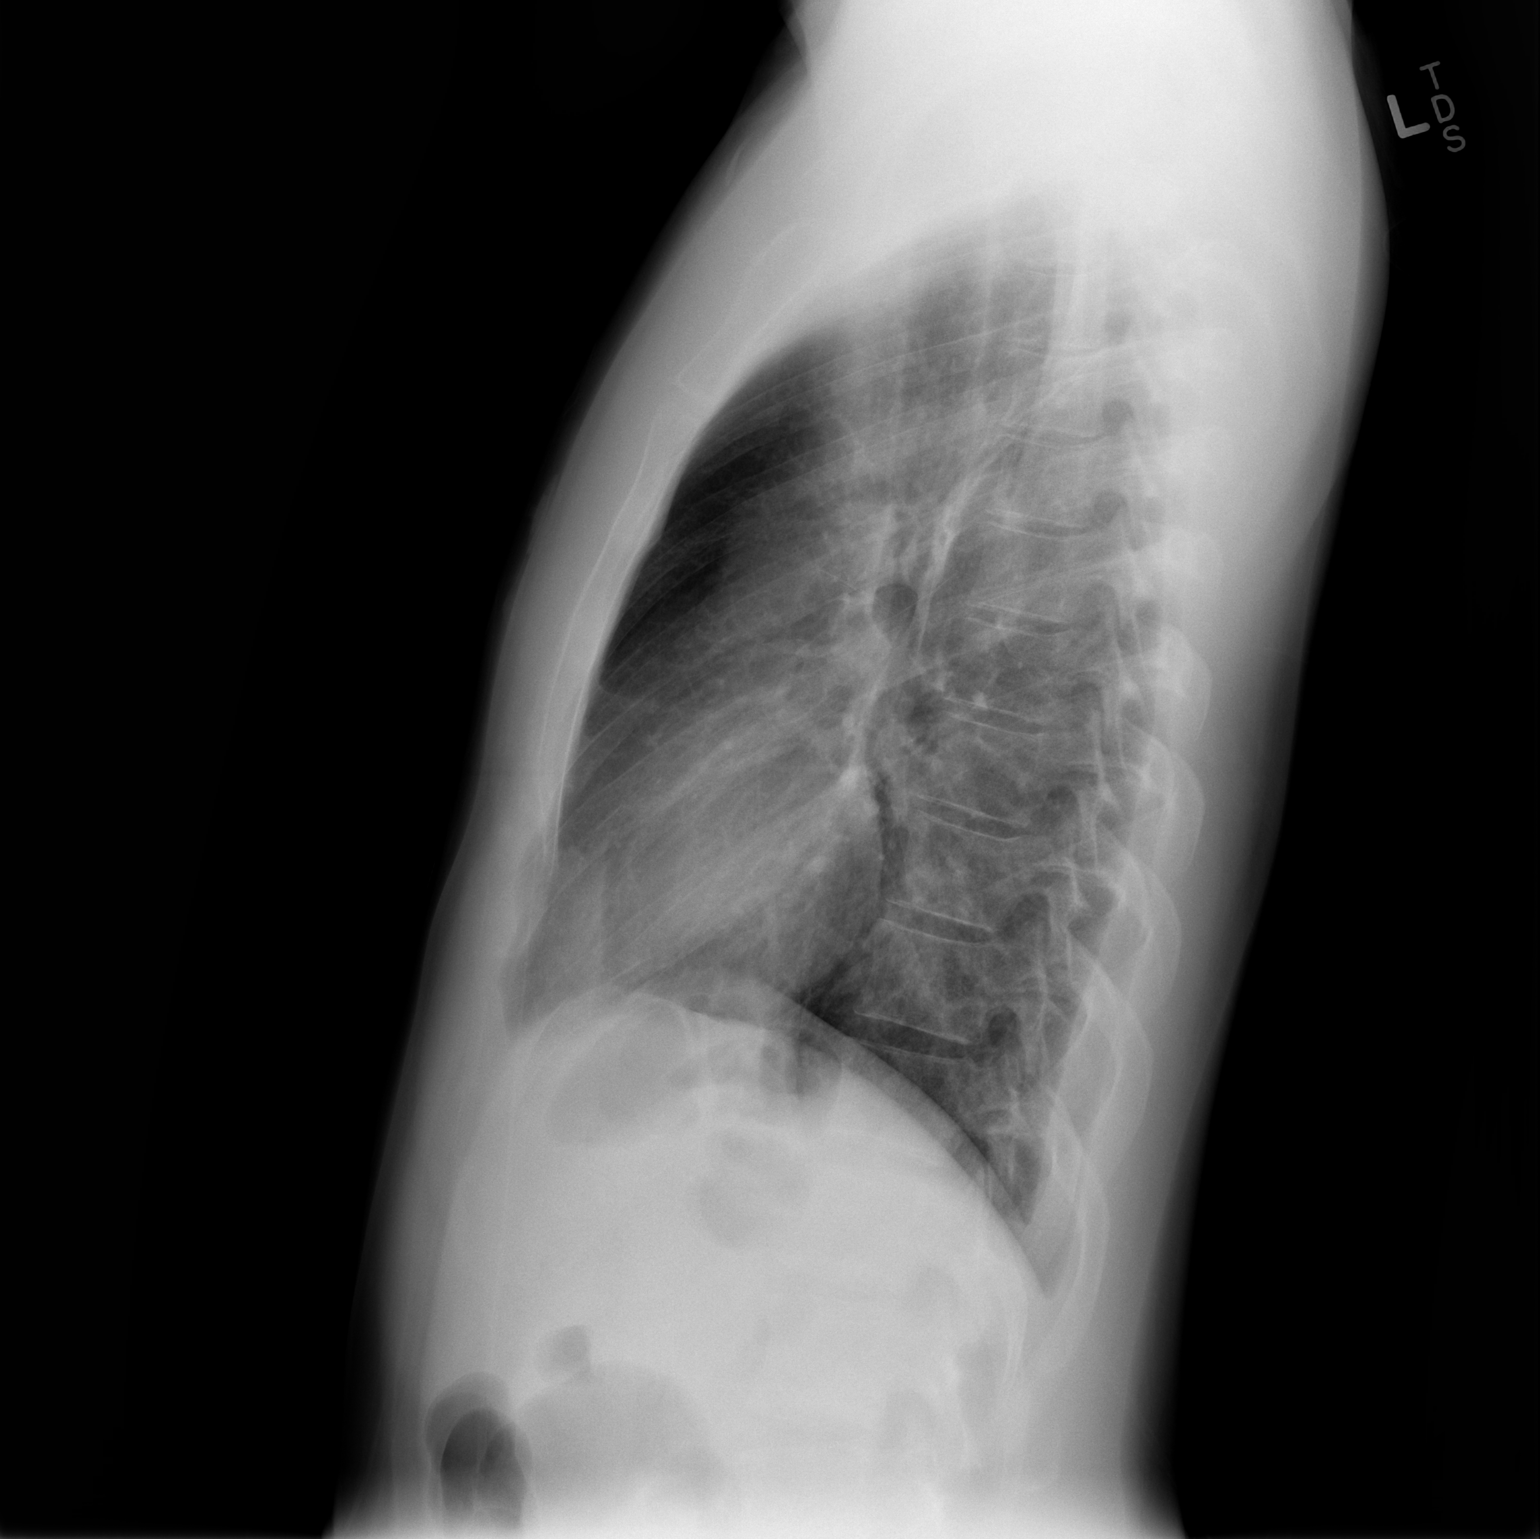

[2 of 2 positions shown; findings below may reference images not displayed]

FINDINGS: Lung volumes are normal. No consolidative airspace disease. No
pleural effusions. No pneumothorax. No pulmonary nodule or mass
noted. Pulmonary vasculature and the cardiomediastinal silhouette
are within normal limits.
IMPRESSION: No radiographic evidence of acute cardiopulmonary disease.

## 2016-10-10 ENCOUNTER — Inpatient Hospital Stay
Admit: 2016-10-10 | Discharge: 2016-10-10 | Disposition: A | Discharge status: Home / Self Care | Payer: MEDICAID | Attending: Emergency Medicine

## 2016-10-10 DIAGNOSIS — R369 Urethral discharge, unspecified: Secondary | ICD-10-CM

## 2016-10-10 LAB — URINALYSIS W/ REFLEX CULTURE
Bacteria: NEGATIVE /hpf
Bilirubin: NEGATIVE
Blood: NEGATIVE
Glucose: NEGATIVE mg/dL
Ketone: NEGATIVE mg/dL
Leukocyte Esterase: NEGATIVE
Nitrites: NEGATIVE
Protein: NEGATIVE mg/dL
Specific gravity: 1.018 (ref 1.003–1.030)
Urobilinogen: 1 EU/dL (ref 0.2–1.0)
pH (UA): 6 (ref 5.0–8.0)

## 2016-10-10 MED ORDER — LIDOCAINE (PF) 10 MG/ML (1 %) IJ SOLN
101 mg/mL (1 %) | INTRAMUSCULAR | Status: AC
Start: 2016-10-10 — End: 2016-10-10
  Administered 2016-10-10: 18:00:00 via INTRAMUSCULAR

## 2016-10-10 MED ORDER — AZITHROMYCIN 250 MG TAB
250 mg | ORAL | Status: AC
Start: 2016-10-10 — End: 2016-10-10
  Administered 2016-10-10: 18:00:00 via ORAL

## 2016-10-10 MED FILL — CEFTRIAXONE 250 MG SOLUTION FOR INJECTION: 250 mg | INTRAMUSCULAR | Qty: 250

## 2016-10-10 MED FILL — AZITHROMYCIN 250 MG TAB: 250 mg | ORAL | Qty: 4

## 2016-10-10 NOTE — ED Provider Notes (Signed)
EMERGENCY DEPARTMENT HISTORY AND PHYSICAL EXAM      Date: 10/10/2016  Patient Name: Willie Gray    History of Presenting Illness     Chief Complaint   Patient presents with   ??? Penile Discharge     Pt states he has been having discharge after urinating x months.       History Provided By: Patient    HPI: Willie Gray, 41 y.o. male presents ambulatory to the ED with c/o penile discharge when urinating for several months.  Pt with occasional dysuria and only 1 episode of hematuria.  Pt states he notices the discharge more frequently with having a BM.  Pt denied h/o similar sx prior to the onset of these symptoms a month ago.  No fever/chills.  No abdomen/rectal pain.  No h/o kidney stones or recurrent STDs in the past.  No N/V/D.  No penile rash/lesion.      Chief Complaint: penile discharge  Duration:  Months  Timing:  Acute  Location: penis  Quality: c/o discharge, denied pain  Severity: Mild  Modifying Factors: pt states increased discharge noted with a BM  Associated Symptoms: no associated symptoms      There are no other complaints, changes, or physical findings at this time.    PCP: None      Past History     Past Medical History:  History reviewed. No pertinent past medical history.    Past Surgical History:  History reviewed. No pertinent surgical history.    Family History:  History reviewed. No pertinent family history.    Social History:  Social History   Substance Use Topics   ??? Smoking status: None   ??? Smokeless tobacco: None   ??? Alcohol use None       Allergies:  No Known Allergies      Review of Systems   Review of Systems   Constitutional: Negative for chills and fever.   HENT: Negative for congestion, rhinorrhea and sore throat.    Respiratory: Negative for cough and shortness of breath.    Cardiovascular: Negative for chest pain and palpitations.   Gastrointestinal: Negative for abdominal pain, diarrhea, nausea and vomiting.   Endocrine: Negative for polydipsia, polyphagia and polyuria.    Genitourinary: Positive for discharge. Negative for decreased urine volume, difficulty urinating, dysuria, flank pain, frequency, genital sores, hematuria, penile pain, scrotal swelling and testicular pain.   Musculoskeletal: Negative for neck pain and neck stiffness.   Skin: Negative for rash and wound.   Allergic/Immunologic: Negative for food allergies and immunocompromised state.   Neurological: Negative for dizziness, weakness and headaches.   Psychiatric/Behavioral: Negative for agitation and confusion.       Physical Exam   Physical Exam   Constitutional: He is oriented to person, place, and time. He appears well-developed and well-nourished. No distress.   WDWN AA male, alert, in NAD   HENT:   Head: Normocephalic and atraumatic.   Nose: Nose normal.   Mouth/Throat: Oropharynx is clear and moist. No oropharyngeal exudate.   Eyes: Conjunctivae and EOM are normal. Pupils are equal, round, and reactive to light. Right eye exhibits no discharge. Left eye exhibits no discharge. No scleral icterus.   Neck: Normal range of motion. Neck supple. No JVD present. No tracheal deviation present. No thyromegaly present.   Cardiovascular: Normal rate, regular rhythm and normal heart sounds.    Pulmonary/Chest: Effort normal and breath sounds normal. No respiratory distress. He has no wheezes.   Abdominal: Soft. He  exhibits no distension. There is no tenderness. There is no rebound and no guarding.   Musculoskeletal: Normal range of motion. He exhibits no edema.   Lymphadenopathy:     He has no cervical adenopathy.   Neurological: He is alert and oriented to person, place, and time. He exhibits normal muscle tone. Coordination normal.   Skin: Skin is warm and dry. No rash noted. He is not diaphoretic. No erythema.   Psychiatric: He has a normal mood and affect. His behavior is normal. Judgment normal.   Nursing note and vitals reviewed.      Diagnostic Study Results     Labs -     Recent Results (from the past 12 hour(s))    URINALYSIS W/ REFLEX CULTURE    Collection Time: 10/10/16 12:27 PM   Result Value Ref Range    Color YELLOW/STRAW      Appearance CLEAR CLEAR      Specific gravity 1.018 1.003 - 1.030      pH (UA) 6.0 5.0 - 8.0      Protein NEGATIVE  NEG mg/dL    Glucose NEGATIVE  NEG mg/dL    Ketone NEGATIVE  NEG mg/dL    Bilirubin NEGATIVE  NEG      Blood NEGATIVE  NEG      Urobilinogen 1.0 0.2 - 1.0 EU/dL    Nitrites NEGATIVE  NEG      Leukocyte Esterase NEGATIVE  NEG      WBC 0-4 0 - 4 /hpf    RBC 0-5 0 - 5 /hpf    Epithelial cells FEW FEW /lpf    Bacteria NEGATIVE  NEG /hpf    UA:UC IF INDICATED CULTURE NOT INDICATED BY UA RESULT CNI      Hyaline cast 0-2 0 - 5 /lpf       Radiologic Studies -   No orders to display         Medical Decision Making   I am the first provider for this patient.    I reviewed the vital signs, available nursing notes, past medical history, past surgical history, family history and social history.    Vital Signs-Reviewed the patient's vital signs.  Patient Vitals for the past 12 hrs:   Temp Pulse Resp BP SpO2   10/10/16 1208 98.3 ??F (36.8 ??C) 64 16 119/78 100 %         Records Reviewed: Nursing Notes, Old Medical Records and Previous Laboratory Studies    Provider Notes (Medical Decision Making):   Prostatitis, UTI, STD    ED Course:   Initial assessment performed. The patients presenting problems have been discussed, and they are in agreement with the care plan formulated and outlined with them.  I have encouraged them to ask questions as they arise throughout their visit.    DISCHARGE NOTE:  1410  The care plan has been outline with the patient and/or family, who verbally conveyed understanding and agreement. Available results have been reviewed. Patient and/or family understand the follow up plan as outlined and discharge instructions. Should their condition deterioration at any time after discharge the patient agrees to return, follow up sooner than  outlined or seek medical assistance at the closest Emergency Room as soon as possible. Questions have been answered. Discharge instructions and educational information regarding the patient's diagnosis as well a list of reasons why the patient would want to seek immediate medical attention, should their condition change, were reviewed directly with the patient/family  PLAN:  1. There are no discharge medications for this patient.    2.   Follow-up Information     Follow up With Details Comments Contact Info    Mikey Kirschner, MD  As needed [address]  [address]  [place] [address]  [phone]      [facility] EMERGENCY DEPT  If symptoms worsen [address]  [place] [address]  [phone]        Return to ED if worse     Diagnosis     Clinical Impression:   1. Penile discharge

## 2016-10-10 NOTE — ED Notes (Signed)
Pt discharged by Fox, [place]. Pt provided with discharge instructions Rx and instructions on follow up care. Pt out of ED in stable condition accompanied by self.

## 2016-10-11 LAB — CHLAMYDIA/GC PCR
Chlamydia amplified: NEGATIVE
N. gonorrhea, amplified: NEGATIVE

## 2016-10-13 LAB — CULTURE, URINE
Colonies Counted: <1000
Colony Count: <1000
Culture result:: NO GROWTH
Culture: NO GROWTH

## 2017-12-03 ENCOUNTER — Emergency Department: Admit: 2017-12-03 | Payer: MEDICAID

## 2017-12-03 ENCOUNTER — Inpatient Hospital Stay
Admit: 2017-12-03 | Discharge: 2017-12-03 | Disposition: A | Discharge status: Home / Self Care | Payer: MEDICAID | Attending: Emergency Medicine

## 2017-12-03 DIAGNOSIS — R0789 Other chest pain: Secondary | ICD-10-CM

## 2017-12-03 LAB — EKG, 12 LEAD, INITIAL
Atrial Rate: 77 {beats}/min
Calculated P Axis: 77 degrees
Calculated R Axis: 74 degrees
Calculated T Axis: 61 degrees
P-R Interval: 138 ms
Q-T Interval: 366 ms
QRS Duration: 82 ms
QTC Calculation (Bezet): 414 ms
Ventricular Rate: 77 {beats}/min

## 2017-12-03 LAB — CBC WITH AUTOMATED DIFF
ABS. BASOPHILS: 0 10*3/uL (ref 0.0–0.1)
ABS. EOSINOPHILS: 0 10*3/uL (ref 0.0–0.4)
ABS. IMM. GRANS.: 0 10*3/uL (ref 0.00–0.04)
ABS. LYMPHOCYTES: 1.2 10*3/uL (ref 0.8–3.5)
ABS. MONOCYTES: 0.4 10*3/uL (ref 0.0–1.0)
ABS. NEUTROPHILS: 3.8 10*3/uL (ref 1.8–8.0)
ABSOLUTE NRBC: 0 10*3/uL (ref 0.00–0.01)
BASOPHILS: 1 % (ref 0–1)
EOSINOPHILS: 1 % (ref 0–7)
HCT: 47 % (ref 36.6–50.3)
HGB: 15.2 g/dL (ref 12.1–17.0)
IMMATURE GRANULOCYTES: 0 % (ref 0.0–0.5)
LYMPHOCYTES: 22 % (ref 12–49)
MCH: 28.6 PG (ref 26.0–34.0)
MCHC: 32.3 g/dL (ref 30.0–36.5)
MCV: 88.5 FL (ref 80.0–99.0)
MONOCYTES: 7 % (ref 5–13)
MPV: 12.4 FL (ref 8.9–12.9)
NEUTROPHILS: 69 % (ref 32–75)
NRBC: 0 PER 100 WBC
PLATELET: 160 10*3/uL (ref 150–400)
RBC: 5.31 M/uL (ref 4.10–5.70)
RDW: 13.7 % (ref 11.5–14.5)
WBC: 5.3 10*3/uL (ref 4.1–11.1)

## 2017-12-03 LAB — POC TROPONIN-I
POC Troponin I: <0.04 ng/mL (ref 0.00–0.08)
Troponin-I (POC): <0.04 ng/mL (ref 0.00–0.08)

## 2017-12-03 LAB — CBC WITH AUTO DIFFERENTIAL
Basophils %: 1 % (ref 0–1)
Basophils Absolute: 0 10*3/uL (ref 0.0–0.1)
Eosinophils %: 1 % (ref 0–7)
Eosinophils Absolute: 0 10*3/uL (ref 0.0–0.4)
Granulocyte Absolute Count: 0 10*3/uL (ref 0.00–0.04)
Hematocrit: 47 % (ref 36.6–50.3)
Hemoglobin: 15.2 g/dL (ref 12.1–17.0)
Immature Granulocytes: 0 % (ref 0.0–0.5)
Lymphocytes %: 22 % (ref 12–49)
Lymphocytes Absolute: 1.2 10*3/uL (ref 0.8–3.5)
MCH: 28.6 PG (ref 26.0–34.0)
MCHC: 32.3 g/dL (ref 30.0–36.5)
MCV: 88.5 FL (ref 80.0–99.0)
MPV: 12.4 FL (ref 8.9–12.9)
Monocytes %: 7 % (ref 5–13)
Monocytes Absolute: 0.4 10*3/uL (ref 0.0–1.0)
NRBC Absolute: 0 10*3/uL (ref 0.00–0.01)
Neutrophils %: 69 % (ref 32–75)
Neutrophils Absolute: 3.8 10*3/uL (ref 1.8–8.0)
Nucleated RBCs: 0 PER 100 WBC
Platelets: 160 10*3/uL (ref 150–400)
RBC: 5.31 M/uL (ref 4.10–5.70)
RDW: 13.7 % (ref 11.5–14.5)
WBC: 5.3 10*3/uL (ref 4.1–11.1)

## 2017-12-03 LAB — EKG 12-LEAD
Atrial Rate: 77 {beats}/min
P Axis: 77 degrees
P-R Interval: 138 ms
Q-T Interval: 366 ms
QRS Duration: 82 ms
QTc Calculation (Bazett): 414 ms
R Axis: 74 degrees
T Axis: 61 degrees
Ventricular Rate: 77 {beats}/min

## 2017-12-03 MED ORDER — FAMOTIDINE 20 MG TAB
20 mg | ORAL_TABLET | Freq: Two times a day (BID) | ORAL | 0 refills | Status: AC
Start: 2017-12-03 — End: 2017-12-13

## 2017-12-03 MED ORDER — FAMOTIDINE (PF) 20 MG/2 ML IV
202 mg/2 mL | INTRAVENOUS | Status: AC
Start: 2017-12-03 — End: 2017-12-03
  Administered 2017-12-03: 17:00:00 via INTRAVENOUS

## 2017-12-03 MED ORDER — ASPIRIN 81 MG CHEWABLE TAB
81 mg | ORAL | Status: AC
Start: 2017-12-03 — End: 2017-12-03
  Administered 2017-12-03: 17:00:00 via ORAL

## 2017-12-03 MED FILL — CHILDREN'S ASPIRIN 81 MG CHEWABLE TABLET: 81 mg | ORAL | Qty: 4

## 2017-12-03 MED FILL — FAMOTIDINE (PF) 20 MG/2 ML IV: 20 mg/2 mL | INTRAVENOUS | Qty: 2
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
## 2017-12-03 NOTE — ED Notes (Signed)
Setting up tele psyche for [facility] evaluation by Kim.

## 2017-12-03 NOTE — ED Notes (Signed)
1 unsuccessful attempt to obtain labs.  Pt currently being evaluated by [facility].
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
## 2017-12-03 NOTE — ED Notes (Signed)
Formatting of this note might be different from the original.  Setting up tele psyche for [facility] evaluation by Selena BattenKim.    Electronically signed by Talbert NanWare, Barbara A, RN at 12/03/2017 11:49 AM EST

## 2017-12-03 NOTE — ED Notes (Signed)
Setting up tele psyche for [facility] Smart evaluation by Selena BattenKim.
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
## 2017-12-03 NOTE — ED Notes (Signed)
Formatting of this note might be different from the original.  1 unsuccessful attempt to obtain labs.  Pt currently being evaluated by [facility].    Electronically signed by Talbert NanWare, Barbara A, RN at 12/03/2017 11:50 AM EST

## 2017-12-03 NOTE — ED Notes (Signed)
1 unsuccessful attempt to obtain labs.  Pt currently being evaluated by [facility].
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
## 2022-01-21 ENCOUNTER — Ambulatory Visit: Admit: 2022-01-21 | Discharge: 2022-01-21 | Payer: MEDICAID

## 2022-01-21 DIAGNOSIS — K047 Periapical abscess without sinus: Secondary | ICD-10-CM

## 2022-01-21 MED ORDER — AMOXICILLIN-POT CLAVULANATE 875-125 MG PO TABS
875-125 MG | ORAL_TABLET | Freq: Two times a day (BID) | ORAL | 0 refills | Status: AC
Start: 2022-01-21 — End: 2022-01-31

## 2022-01-21 MED ORDER — ALBUTEROL SULFATE HFA 108 (90 BASE) MCG/ACT IN AERS
108 (90 Base) MCG/ACT | Freq: Four times a day (QID) | RESPIRATORY_TRACT | 0 refills | Status: AC | PRN
Start: 2022-01-21 — End: ?

## 2022-01-21 NOTE — Patient Instructions (Signed)
Symptoms are consistent with a dental abscess  Will treat infection with Augmentin 2x daily for 10 days  It is critical to get in to see a dentist as that is the only thing that will really fix this issue  Information given for local emergency dental clinics  Go to the ER with any severe worsening pain, fevers, chills, or if unable to take in foods and fluids    Your blood pressure was elevated today. Please monitor this at home over the next several weeks and follow up with PCP if readings remain above 140/90  Albuterol inhaler refilled, please discuss this with PCP to determine if a daily maintenance inhaler is indicated  Information given on local PCPs

## 2022-01-21 NOTE — Progress Notes (Signed)
Willie Gray (DOB:  10/14/1975) is a 47 y.o. male,New patient, here for evaluation of the following chief complaint(s):  Dental Pain (C/o tooth pain. Sx 2 days.)      ASSESSMENT/PLAN:    Symptoms are consistent with a dental abscess  Will treat infection with Augmentin 2x daily for 10 days  It is critical to get in to see a dentist as that is the only thing that will really fix this issue  Information given for local emergency dental clinics, please call around to see if you can get in sooner than the March appointment you currently have  Go to the ER with any severe worsening pain, fevers, chills, or if unable to take in foods and fluids    Your blood pressure was elevated today. Please monitor this at home over the next several weeks and follow up with PCP if readings remain above 140/90  Albuterol inhaler refilled, please discuss this with PCP to determine if a daily maintenance inhaler is indicated    SUBJECTIVE/OBJECTIVE:  HPI     47 y.o. male presents with symptoms of dental pain for the past 2-3 days. He states that he had a tooth break off in his left lower jaw about 5 days ago when eating something hard. The pain was tolerable initially, but he is now having severe left lower jaw pain for the past 3 days that has made it difficult to eat and sleep. He had a similar experience with another tooth about 20 years ago. He denies fevers or chills. No difficulty breathing or swallowing. He has several missing and broken teeth present that he reports do cause him some pain. He has made an initial dental visit with someone but it isn't for about 2 months.         Vitals:    01/21/22 1602   BP: (!) 161/118   Site: Left Upper Arm   Position: Sitting   Cuff Size: Medium Adult   Pulse: 87   Resp: 18   Temp: 97.3 F (36.3 C)   TempSrc: Temporal   SpO2: 98%   Weight: 87.1 kg (192 lb)       No results found for this visit on 01/21/22.     Physical Exam  Vitals and nursing note reviewed.   Constitutional:       General:  He is not in acute distress.     Appearance: Normal appearance. He is not ill-appearing.   HENT:      Head: Normocephalic and atraumatic.      Mouth/Throat:      Dentition: Abnormal dentition. Does not have dentures. Dental tenderness, dental caries and dental abscesses present.     Cardiovascular:      Rate and Rhythm: Normal rate and regular rhythm.      Pulses: Normal pulses.      Heart sounds: Normal heart sounds. No murmur heard.     No friction rub. No gallop.   Pulmonary:      Effort: Pulmonary effort is normal. No respiratory distress.      Breath sounds: Normal breath sounds. No wheezing, rhonchi or rales.   Skin:     General: Skin is warm and dry.   Neurological:      General: No focal deficit present.      Mental Status: He is alert and oriented to person, place, and time.          An electronic signature was used to authenticate this note.  Patrici Ranks, APRN - NP

## 2022-03-14 ENCOUNTER — Ambulatory Visit: Admit: 2022-03-14 | Discharge: 2022-03-14 | Payer: MEDICAID | Attending: Family

## 2022-03-14 DIAGNOSIS — K047 Periapical abscess without sinus: Secondary | ICD-10-CM

## 2022-03-14 MED ORDER — DICLOFENAC SODIUM 75 MG PO TBEC
75 | ORAL_TABLET | Freq: Two times a day (BID) | ORAL | 0 refills | Status: AC | PRN
Start: 2022-03-14 — End: ?

## 2022-03-14 MED ORDER — CLINDAMYCIN HCL 300 MG PO CAPS
300 | ORAL_CAPSULE | Freq: Three times a day (TID) | ORAL | 0 refills | Status: AC
Start: 2022-03-14 — End: 2022-03-24

## 2022-03-14 NOTE — Progress Notes (Signed)
Subjective: (As above and below)     The patient/guardian gave verbal consent to treat.        Chief Complaint   Patient presents with    Dental Pain     Tooth pain x 5 days      HPI  Willie Gray is a 47 y.o. male who presents for evaluation of : dental pain and abscess. Symptom onset 5 days ago. Had similar symptoms in past that responded well to antibiotics . Preceding illness: none.  No other identified aggravating or alleviating factors. Symptoms are constant and overall worse. Denies fever, chills, neck swelling, trouble swallowing      Review of Systems    Review of Systems - negative except as listed above    Reviewed PmHx, RxHx, FmHx, SocHx, AllgHx and updated in chart.  History reviewed. No pertinent family history.    Past Medical History:   Diagnosis Date    Arthritis     Asthma       Social History     Socioeconomic History    Marital status: Single     Spouse name: None    Number of children: None    Years of education: None    Highest education level: None   Tobacco Use    Smoking status: Never     Passive exposure: Never    Smokeless tobacco: Never   Substance and Sexual Activity    Alcohol use: Not Currently    Drug use: Never          Current Outpatient Medications   Medication Sig    clindamycin (CLEOCIN) 300 MG capsule Take 1 capsule by mouth 3 times daily for 10 days    diclofenac (VOLTAREN) 75 MG EC tablet Take 1 tablet by mouth 2 times daily as needed for Pain    albuterol sulfate HFA (VENTOLIN HFA) 108 (90 Base) MCG/ACT inhaler Inhale 2 puffs into the lungs 4 times daily as needed for Wheezing     No current facility-administered medications for this visit.       Objective:     Vitals:    03/14/22 1447   BP: 138/88   Site: Left Upper Arm   Position: Sitting   Cuff Size: Large Adult   Pulse: 79   Resp: 18   Temp: 98.3 F (36.8 C)   TempSrc: Oral   SpO2: 99%   Weight: 86.5 kg (190 lb 12.8 oz)       Physical Exam   General appearance - appears well hydrated and does not appear toxic, no  acute distress  Eyes - EOMs intact. Non injected. No scleral icterus   Ears - no external swelling. TMs normal bilat.  Nose - patent. No purulent drainage  Mouth - OP clear without swelling, exudate or lesion. Mucus membranes moist. Uvula midline. Swelling around molar. TTP with tongue depressor. Good tongue protrusion. No neck swelling  Neck/Lymphatics - trachea midline, full AROM, no LAD of neck  Chest - Normal breathing effort no wheeze rales, rhonchi or diminishments bilaterally.  Heart - RRR, no murmurs  Skin - no observable rashes or pallor  Neurologic- alert and oriented x 3  Psychiatric- normal mood, behavior and though content.      Assessment/ Plan:     1. Dental abscess  -     clindamycin (CLEOCIN) 300 MG capsule; Take 1 capsule by mouth 3 times daily for 10 days, Disp-30 capsule, R-0Normal  -     diclofenac (VOLTAREN) 75  MG EC tablet; Take 1 tablet by mouth 2 times daily as needed for Pain, Disp-20 tablet, R-0Normal       Dental abscess no evidence of Ludwigs  Start clindamycin given PCN allergy  Diclofenac per orders for pain  Advised seeing dentist within 3 weeks ASAP    Follow up:  Advised to go to ER/ED immediately for any new, worsening or changes         Koren Bound, APRN - NP

## 2023-01-09 ENCOUNTER — Ambulatory Visit: Admit: 2023-01-09 | Discharge: 2023-01-10 | Payer: Medicaid (Managed Care) | Primary: Internal Medicine

## 2023-01-09 VITALS — BP 139/93 | HR 94 | Temp 98.00000°F | Resp 16 | Wt 203.0 lb

## 2023-01-09 DIAGNOSIS — U071 COVID-19: Secondary | ICD-10-CM

## 2023-01-09 NOTE — Patient Instructions (Signed)
 Viral Covid does not require antibiotic as they do not treat / help viral illnesses. Viral symptoms will usually improve over the next week, but lingering cough or congestion can take several weeks to completely resolve - this is normal.   To prevent dehyd

## 2023-01-09 NOTE — Progress Notes (Signed)
 Patient Name: Willie Gray   Date of Birth:  1975-02-15   Patient Status: Established patient,   Chief Complaint: Cough (Cough, congestion and chest pressure./X 6 days )      ________________________________________________________________________________

## 2023-01-10 LAB — POCT COVID-19, ANTIGEN
Lot Number: 899233
SARS-COV-2, POC: DETECTED — AB

## 2023-01-10 LAB — POCT INFLUENZA A/B ANTIGEN
Inflenza A Ag: NEGATIVE
Influenza B Ag: NEGATIVE

## 2023-01-10 LAB — POCT RAPID STREP A: Strep A Ag: NOT DETECTED

## 2023-01-10 MED ORDER — METHYLPREDNISOLONE 4 MG PO TBPK
4 | PACK | ORAL | 0 refills | Status: AC
Start: 2023-01-10 — End: 2023-01-15

## 2023-01-10 MED ORDER — BENZONATATE 200 MG PO CAPS
200 | ORAL_CAPSULE | Freq: Three times a day (TID) | ORAL | 0 refills | Status: AC | PRN
Start: 2023-01-10 — End: 2023-01-19

## 2023-07-30 ENCOUNTER — Ambulatory Visit
Admit: 2023-07-30 | Discharge: 2023-07-30 | Payer: Medicaid (Managed Care) | Attending: Family | Primary: Internal Medicine

## 2023-07-30 VITALS — BP 156/100 | HR 99 | Temp 97.50000°F | Resp 16 | Wt 207.0 lb

## 2023-07-30 DIAGNOSIS — K047 Periapical abscess without sinus: Principal | ICD-10-CM

## 2023-07-30 MED ORDER — LIDOCAINE VISCOUS HCL 2 % MT SOLN
2 | OROMUCOSAL | 0 refills | Status: AC | PRN
Start: 2023-07-30 — End: ?

## 2023-07-30 MED ORDER — CLINDAMYCIN HCL 300 MG PO CAPS
300 | ORAL_CAPSULE | Freq: Three times a day (TID) | ORAL | 0 refills | Status: AC
Start: 2023-07-30 — End: 2023-08-09

## 2023-07-30 NOTE — Patient Instructions (Signed)
 Clindamycin  300 mg: 1 pill 3 times daily x 10 days    Magic mouthwash    Mix in a 1:1:1 Ratio  Placed in an airtight container and put in the fridge.  Able stay good for up to 5 days in the fridge  Soak cotton balls in the solution and placed on area of pain every 3 hours as needed    Viscous Lidocaine  Maalox  Diphenhydramine 12.5 mg / 5 mL

## 2023-07-30 NOTE — Progress Notes (Signed)
 Willie Gray (DOB:  October 24, 1975) is a 48 y.o. male,Established patient, here for evaluation of the following chief complaint(s):  Dental Pain (Right side toothache x week )      Assessment & Plan :  Visit Diagnoses and Associated Orders         Dental infection    -  Primary    clindamycin  (CLEOCIN ) 300 MG capsule [9621]      lidocaine viscous hcl (XYLOCAINE) 2 % SOLN solution [27898]                 Patient presents for dental pain due to suspected dental infection.  Patient not immunosuppressed, afebrile and well appearing with patent airway, have low suspicfion for deep space infection or any concern for airway compromise. Based on history, physical, and work up. No evidence of tooth fracture, avulsion, or bleeding socket. No evidence of RPA, PTA, Ludwig's angina, periapical abscess. Instructed patient to continue to treat pain with ibuprofen/acetaminophen until they see a dentist, visc Lidocaine was also prescribed for pain. Antibiotic prescribed with concern for infection, Clindamycin  was sent to pharmacy.  Viscous lidocaine also prescribed.  Patient discharged home and will follow up with dentist. List of dentist in area was provided. Discussed return precautions give. ER precautions discussed.  Patient verbalized understanding of precautions and agreement with care plan.     Magic mouthwash    Mix in a 1:1:1 Ratio  Placed in an airtight container and put in the fridge.  Able stay good for up to 5 days in the fridge  Soak cotton balls in the solution and placed on area of pain every 3 hours as needed    Viscous Lidocaine  Maalox  Diphenhydramine 12.5 mg / 5 mL       Subjective :    Dental Pain          48 y.o. male presents with dental pain x 3 days.  He has taken Tylenol 3 and some old amoxicillin .  No improvement with these.  He is single smiles last year for a different tooth and had that removed.  He has not yet called them about this tooth.  He has been having some pain with chewing and swallowing,  noticed a little mild facial swelling.  No trouble breathing, no globus sensation, no fever or chills       Vitals:    07/30/23 1519 07/30/23 1524   BP: (!) 148/104 (!) 156/100   BP Site: Right Upper Arm Right Upper Arm   Patient Position: Sitting Sitting   BP Cuff Size: Large Adult Large Adult   Pulse: 99    Resp: 16    Temp: 97.5 F (36.4 C)    TempSrc: Oral    SpO2: 97%    Weight: 93.9 kg (207 lb)        No results found for this visit on 07/30/23.      Objective   Physical Exam  Constitutional:       General: He is not in acute distress.     Appearance: He is not ill-appearing or toxic-appearing.   HENT:      Head: Normocephalic.      Jaw: No trismus or swelling.      Comments: No significant facial swelling     Mouth/Throat:      Mouth: Mucous membranes are moist. No angioedema.      Dentition: Dental tenderness and dental caries present.      Tongue: No lesions. Tongue does  not deviate from midline.      Pharynx: Oropharynx is clear. Uvula midline.        Comments: Tooth #27 is dull gray and very painful to the touch.  No submandibular or submental fullness.  No areas of fluctuance, no signs of an abscess  Eyes:      Conjunctiva/sclera: Conjunctivae normal.   Cardiovascular:      Rate and Rhythm: Normal rate.   Pulmonary:      Effort: Pulmonary effort is normal. No respiratory distress.      Breath sounds: No stridor.   Musculoskeletal:      Cervical back: Neck supple.   Lymphadenopathy:      Cervical: No cervical adenopathy.   Skin:     Findings: No bruising, erythema or rash.   Neurological:      Mental Status: He is oriented to person, place, and time.   Psychiatric:         Behavior: Behavior normal.               An electronic signature was used to authenticate this note.    Amelie GORMAN Cedar, ACNP
